# Patient Record
Sex: Female | Born: 1975 | Race: White | Hispanic: No | Marital: Married | Smoking: Current every day smoker
Health system: Southern US, Academic
[De-identification: ages and names within clinical notes are randomized; demographics above are authoritative.]

## PROBLEM LIST (undated history)

## (undated) DIAGNOSIS — L309 Dermatitis, unspecified: Secondary | ICD-10-CM

## (undated) DIAGNOSIS — R7689 Other specified abnormal immunological findings in serum: Secondary | ICD-10-CM

## (undated) DIAGNOSIS — E876 Hypokalemia: Secondary | ICD-10-CM

## (undated) DIAGNOSIS — E559 Vitamin D deficiency, unspecified: Secondary | ICD-10-CM

## (undated) DIAGNOSIS — F17200 Nicotine dependence, unspecified, uncomplicated: Secondary | ICD-10-CM

## (undated) DIAGNOSIS — L708 Other acne: Secondary | ICD-10-CM

## (undated) DIAGNOSIS — I10 Essential (primary) hypertension: Secondary | ICD-10-CM

## (undated) DIAGNOSIS — R768 Other specified abnormal immunological findings in serum: Secondary | ICD-10-CM

## (undated) DIAGNOSIS — K219 Gastro-esophageal reflux disease without esophagitis: Secondary | ICD-10-CM

## (undated) DIAGNOSIS — R4586 Emotional lability: Secondary | ICD-10-CM

## (undated) DIAGNOSIS — F419 Anxiety disorder, unspecified: Secondary | ICD-10-CM

## (undated) DIAGNOSIS — M1990 Unspecified osteoarthritis, unspecified site: Secondary | ICD-10-CM

## (undated) DIAGNOSIS — E538 Deficiency of other specified B group vitamins: Secondary | ICD-10-CM

## (undated) HISTORY — DX: Gastro-esophageal reflux disease without esophagitis: K21.9

## (undated) HISTORY — DX: Essential (primary) hypertension: I10

## (undated) HISTORY — DX: Other specified abnormal immunological findings in serum: R76.89

## (undated) HISTORY — DX: Vitamin D deficiency, unspecified: E55.9

## (undated) HISTORY — DX: Other specified abnormal immunological findings in serum: R76.8

## (undated) HISTORY — DX: Other acne: L70.8

## (undated) HISTORY — DX: Emotional lability: R45.86

## (undated) HISTORY — DX: Deficiency of other specified B group vitamins: E53.8

## (undated) HISTORY — DX: Anxiety disorder, unspecified: F41.9

## (undated) HISTORY — DX: Hypokalemia: E87.6

## (undated) HISTORY — DX: Nicotine dependence, unspecified, uncomplicated: F17.200

## (undated) HISTORY — DX: Dermatitis, unspecified: L30.9

---

## 1998-02-17 ENCOUNTER — Other Ambulatory Visit (HOSPITAL_COMMUNITY): Payer: Self-pay

## 2021-05-05 ENCOUNTER — Encounter (RURAL_HEALTH_CENTER): Payer: Self-pay | Admitting: Physician Assistant

## 2021-05-11 ENCOUNTER — Encounter (RURAL_HEALTH_CENTER): Payer: Self-pay | Admitting: Physician Assistant

## 2021-05-11 NOTE — Progress Notes (Unsigned)
Chronic Disease Management-AMB  This 46 year old white female presents today for CDM.    Some prior records were brought forward in order to continue completeness and accuracy of the medical record. Changes were made on this visit to reflect current status.      BP concern/HTN:  BP readings at work:  Since 12/14/20:  130/89, 127/90, 118/87, 137/85.   symptoms:  01/19/21:  Cough started 1 week into the Lisinopril prescription. After discussion, will change to an ARB.  She denies any chest pain, shortness of breath, palpitations, dizziness, near-syncope, or syncope.  No edema.  No unusual headaches.  She continues to believe that stress plays a role in her blood pressure issues.  from 12/08/20:  Stress related. Learning a new job still at [employer]. Husband lost job late July. Sister's CP is causing her pain. Nerves getting the best of her per pt. Caused eczema flare. Says BP was "up" at [facility] on 11/20/20. Can tell when it's up--feels warm and like she could jump out of her skin.   meds:   Will change to Benicar 20 mg 1 p.o. daily.  Stop Lisinopril 10 due to cough.   investigations:   Will need routine EKG in the future as well.   labs:   See below.    Diarrhea:  symptoms:    01/19/21:  Says the Bentyl cut down the number of times a day from 8 or 9 to 4 or 5 with BID dosing. Stopped her probiotics. No major dietary changes. No recent travel. More gas. No particular trigger food that is obvious. No abdominal pain or blood in her stool. Ready to check GB as the next step. Schedule GB US and Hida scan if needed. Off on 11/18 and day before and after Thanksgiving and Nov. 30. Dec. is okay.  from 12/08/20:  Since increased stress 10/31/20. No abdominal pain or blood in her stool. Going 6 to 8 times a day. Stomach gurgles. Taking probiotics. Tried Tums.  meds:   Try Dicyclomine 20 mg PRN.  investigations:   As of 01/19/21:  Ready to check GB as the next step. Schedule GB US and Hida scan if needed.  from 12/08/20:  Declined at this  time.    Mood swings and Anxiety:  onset:  years ago  symptoms:   01/19/21:  Better. Not as bad. Itching stopped. Can see the benefit of the 20 mg Lexapro. No SI or HI. Doing okay at this point. No further changes needed.  from 12/08/20: Admits to increased stress. Learning a new job still at [employer]. Husband lost job late July. Sister's CP is causing her pain. Nerves getting the best of her per pt. Caused eczema flare. Like a mouse on a wheel that cannot stop. She feels it's causing her diarrhea as well. Jump out of skin. Anxious. Not depressed. Denies any SI or HI. Will increase to Lexapro 20 mg QD.  from 07/08/20:  Aleene Davidson doing well at [facility]. New job. Pleased with meds. No changes needed. No referrals needed. Denies any SI or HI.  see previous related entries by other providers  Meds:   Wellbutrin SR 200 mg 1 po BID.  Lexapro 20.  Previously, on Lexapro 10 mg once daily (Lexapro added fall of 2018). No side effects. (Again, Seroquel increased appetite which caused weight gain. Cymbalta was too expensive in the past. Effexor did not work and kept her awake for 32 hours. Has also tried Zoloft, Prozac, Celexa, and Buspar)  Referrals:   She still does not want to see a specialist or a counselor at this time.      Eczema:  onset:  off and on for several years  symptoms:   01/19/21:  No current symptoms.  from 11/20/20:  Correlates to increased stress. Very itchy. Legs. Went to [place] on 11/20/20 for a flare up to get a shot since cream was not helping. Then, on her back. Cream helped along with Cetirizine.  from 07/08/20:  Overall stable.  see previous related entries by other providers   Meds:   The Triamcinolone 0.5% BID PRN seems to work better than the Betamethasone 0.05% steroid cream.  referrals:   None currently needed      Hx of Joint pain with positive ANA and RNP:  symptoms:    01/19/21; 12/08/20: Regarding referral to Dr. Albertine Grates:  Declined at this time since she recently started a new job and has limited time off. Her husband  lost his job as well which has created a financial strain.   from 07/08/20: Regarding referral to Dr. Albertine Grates:  Declined at this time since she recently started a new job and has limited time off; also, her insurance is not yet effective.  Will let us know when her new insurance becomes effective.   see previous related entries by other providers  meds:   Naproxen PRN helps.  Referral:  As of 01/19/21; 12/08/20:  Regarding referral to Dr. Albertine Grates:  Declined at this time since she recently started a new job and has limited time off. Her husband lost his job as well which has created a financial strain.   Per pt. on 07/08/20:  Regarding referral to Dr. Albertine Grates:  Declined at this time since she recently started a new job and has limited time off; also, her insurance is not yet effective.  Will let us know when her new insurance becomes effective.   Per pt. on 09/06/18:  Says she did see Dr. Driscilla Moats years ago and he told her she had RA. Prefers a 2nd opinion if she follows up and, after discussion, would like to see Dr. Genene Churn. She had declined referral in the past due to insurance issues at the time but that is no longer a factor for her now.  Did see Dr. Anabel Bene (ortho)  around 2018; was dx with osteoarthritis, per patient.  Per pt. 02/23/16:  declines rheumatology referral.  labs:   On 09/06/18, she agrees to repeat lab testing but did not RTC for it with her other labs that were ordered.  09/28/12: Additional labs to evaluate the + ANA show 1 positive result (RNP antibodies are high at >8.0) which can be seen with mixed connective tissue disorders.  05/30/12: Rheumatoid arthritis lab normal. ANA positive.       Vitamin D and hx of Vit B12 deficiency:   meds:   On 07/08/20:  Reminded her to start B12 1000 mcg daily and Vit D 2000 units daily. Only on MVI daily. Previously, OTC Vitamin D 5000 units daily.  labs:   07/10/19:  Corrected calcium 9.1, Vitamin B12 249.Vitamin D 23.8.  11/30/17:  Corrected calcium 8.5.  B12  370.      Tobacco Use:  01/19/21; 12/08/20; 07/08/20:  Is smoking 1/4 ppd on average. Rarely coughs. Not yet ready to quit. Afraid of gaining weight. Declines referral. We discussed the potential risks of continued smoking. Quit years ago for almost 2 years but then started smoking again. Has over a  20 year hx of smoking.     GERD:  onset:   on meds since late 2019  symptoms:   01/19/21; 12/08/20; 07/08/20:  No breakthrough symptoms. Denies abdominal pain, bloody or black looking stool. No heartburn, reflux or indigestion.  see previous related entries  meds:   Omeprazole 20 mg 1 po QD.  referrals:   none  investigations:   no hx of EGD    Mixed hyperlipidemia:  diet/exercise:   She is not closely watching diet or exercising; room for improvement per pt.   meds:   no current meds  labs:   07/10/19: Total bilirubin 1.0, AST 28, ALT 30.  TC 265, TG 207, HDL 49, LDL 175  11/30/17:  TC 201, TG 231, HDL 40, LDL 115. Total bilirubin 0.4, AST 23, ALT 21.      Additional lab review:   Per pt. on 01/19/21:  She found out that it's best coverage wise if she has labs as part of a well visit. Will schedule a well visit.    07/10/19: WBC 8.6, Hgb 15.9, HCT 46.6, platelets 214.  Sodium 135, potassium 4.3.  BUN 15, creatinine 0.8, GFR greater than 59.  Glucose 90. TSH 3.24.  Routine urinalysis: Negative protein, negative glucose, negative blood, negative leukocytes.  Rheumatoid factor less than 10.0.  ANA positive RNP elevated at 7.8.  11/30/2017: WBC 7.7, Hgb 14.7, HCT 43.3, platelets 220.  Sodium 140, potassium 4.2.  BUN 10, creatinine 0.9, GFR greater than 59.  Fasting glucose 87. TSH 1.65.      Last pap:  Per pt. on 01/19/21:  Good for 8 years with her Mirena based on new guidelines per pt--not due for replacement until April 2025. Had pap by GYN in October 2022. See scanned letter dated 01/08/21 she received from Dr. Lavena Bullion indicating that pap smear was normal. Has order for mammogram which she will schedule.  12/24/19:  Pap:  NEGATIVE  FOR INTRAEPITHELIAL LESION OR MALIGNANCY. Satisfactory for evaluation.  Endocervical and/or squamous metaplastic cells (endocervical component) are present.  08/16/2018: Pap smear: Negative.  EC component present.  07/10/17:  Negative.   See EHR  Sent to [facility] in [place] 06/2015 to have IUD placed.  09/28/12--Pap was negative but no EC component seen.      Mammogram:  Per pt. on 01/19/21:  Has order for mammogram from Dr. Lavena Bullion which she will schedule.  Declined 12/24/19. As of 07/08/20:  Will let us know when her new insurance becomes effective.  10/11/2018: Mammogram:  No findings suspicious for the presence of carcinoma are identified on the current examination.  Followup bilateral screening mammography in 1 year is recommended. Category   1: Final Overall Assessment: Negative  06/05/2017:  Normal.    Immunizations:  [employer] Covid vaccine 05/28/20 at [facility]; had flu shot at work 06/03/20 . She declined Pneumo 23, Tdap and HPV during well visit Oct. 2021.                      Assessment & Plan  (1) Mood swings:        Status: Chronic        Code(s):  R45.86 - Emotional lability  (2) Anxiety:        Status: Chronic        Code(s):  F41.9 - Anxiety disorder, unspecified  (3) Eczema:        Status: Chronic        Code(s):  L30.9 -  Dermatitis, unspecified        Qualifiers:          Eczema type: unspecified  Qualified Code(s): L30.9 - Dermatitis, unspecified  (4) Tobacco use disorder:        Status: Chronic        Code(s):  F17.200 - Nicotine dependence, unspecified, uncomplicated  (5) GERD (gastroesophageal reflux disease):        Status: Chronic        Code(s):  K21.9 - Gastro-esophageal reflux disease without esophagitis        Qualifiers:          Esophagitis presence: without esophagitis  Qualified Code(s): K21.9 - Gastro-esophageal reflux disease without esophagitis  (6) Vitamin D deficiency:        Status: Chronic        Code(s):  E55.9 - Vitamin D deficiency, unspecified  (7) Vitamin B12 deficiency:         Status: Chronic        Code(s):  E53.8 - Deficiency of other specified B group vitamins  (8) Joint pain:        Code(s):  M25.50 - Pain in unspecified joint        Qualifiers:          Joint pain location: unspecified  Qualified Code(s): M25.50 - Pain in unspecified joint  (9) Essential hypertension:        Status: Acute        Code(s):  I10 - Essential (primary) hypertension  (10) Reaction, situational, acute, to stress:        Code(s):  F43.0 - Acute stress reaction  (11) Intermittent diarrhea:        Code(s):  R19.7 - Diarrhea, unspecified  (12) Flatulence:        Code(s):  R14.3 - Flatulence  (13) Medication side effects:        Code(s):  T88.7XXA - Unspecified adverse effect of drug or medicament, initial encounter    Plan    MDM    New/changed medications:  NEW: START Benicar 20 mg 1 po QD for BP. STOP Lisinopril 10 mg QD for HTN due to cough     reminded her to start B12 1000 mcg once daily and vitamin D 2000 units once daily.    Renewed chronic medications: As below    Nursing Procedures: None    Immunization: None this visit    Labs: None this visit; declined; encouraged her to find out if it is better for her to get labs using specific diagnoses versus labs as part of a well visit and she voiced understanding and per pt. on 01/19/21, is best to have with well visit so will get scheduled accordingly    Imaging: GB US and HIDA if [place] negative;  ***Will need routine EKG***; she will schedule her mammogram from order provided by GYN    Referrals: None at this time. Declines GI referral at this time for diarrhea. Declines counseling referral. Says she will consider rheumatology referral to Dr. Albertine Grates when things settle down in life for her.    Counseling Done:  NEW: For hypertension: Start Benicar 20 mg 1 po QD. Stop lisinopril 10 mg daily due to cough  Potential side effects were discussed.  Monitor BP, log, and return to clinic if staying greater than 140/90, either parameter on average.  Bring log of readings  for my review to future visits.  She understands the potential risks of uncontrolled BP.  Low-sodium diet.  Weight  loss.  For  anxiety and mood swings: Continue Lexapro to 20 mg daily.  Declines counseling referral.  Go to ER for any suicidal or homicidal thoughts.  For stool, Bentyl as directed.  She feels this issue is definitely stress related.  Also, now agrees to GB US and HIDA if negative for further evaluation.  Monitor closely.  Routine measures as discussed.  Return to clinic if this persists or becomes worse.  Go to ER for any abdominal pain.  Discussed potential explanations for her joint pain and reviewed previous related abnormal lab. Says she will consider rheumatology referral to Dr. Albertine Grates when life settles down for her.  Symptomatic measures/supportive care as discussed.  For eczema and GERD: Currently stable. Continue current meds. Use her eczema med PRN.  Regarding tobacco abuse:  Advised smoking cessation and she understands the potential risks of continued smoking. She declines referral/assistance at this time.  Yearly well-woman/check-ups advised. Last visit with GYN was Oct. 2022; has IUD removed in 2025 per pt. at the 8 year mark.  Should she develop any severe symptoms or should something change and she develop any SI or HI, go to ER.    Diet/Exercise suggested: Low fat, low cholesterol, low sodium. Heart Healthy. Exercise. Weight loss advised.  Minimize alcohol.  Avoid energy drinks.    Follow up as below--well visit in December-- but if any worsening, incomplete resolution, or new problems occur, patient is to return sooner.          Orders:  Orders  US liver gb and duct 02/15/21 R19.7 - Diarrhea, unspecified, R14.3 - Flatulence    NM hepatobiliary w pharm 02/15/21 R19.7 - Diarrhea, unspecified, R14.3 - Flatulence            Medications:  New  olmesartan (Benicar) 20 mg PO QDAY 30 tabs 1RF      Discontinued  lisinopril     Discontinued Reason:  Therapy Change 10 mg  PO QDAY 90 tabs 0RF         Plan Details  Instructions:      Vitamin B-12      Vitamin D  Please follow up in Follow Up:      schedule well visit in Dec.

## 2021-06-02 ENCOUNTER — Ambulatory Visit (RURAL_HEALTH_CENTER): Payer: 59 | Attending: Physician Assistant | Admitting: Physician Assistant

## 2021-06-02 ENCOUNTER — Other Ambulatory Visit: Payer: Self-pay

## 2021-06-02 ENCOUNTER — Encounter (RURAL_HEALTH_CENTER): Payer: Self-pay | Admitting: Physician Assistant

## 2021-06-02 VITALS — BP 123/80 | HR 113 | Temp 97.2°F | Resp 16 | Wt 240.0 lb

## 2021-06-02 DIAGNOSIS — F17210 Nicotine dependence, cigarettes, uncomplicated: Secondary | ICD-10-CM | POA: Insufficient documentation

## 2021-06-02 DIAGNOSIS — Z0000 Encounter for general adult medical examination without abnormal findings: Secondary | ICD-10-CM

## 2021-06-02 DIAGNOSIS — I10 Essential (primary) hypertension: Secondary | ICD-10-CM

## 2021-06-02 DIAGNOSIS — E559 Vitamin D deficiency, unspecified: Secondary | ICD-10-CM

## 2021-06-02 DIAGNOSIS — R4586 Emotional lability: Secondary | ICD-10-CM

## 2021-06-02 DIAGNOSIS — F419 Anxiety disorder, unspecified: Secondary | ICD-10-CM

## 2021-06-02 DIAGNOSIS — R768 Other specified abnormal immunological findings in serum: Secondary | ICD-10-CM | POA: Insufficient documentation

## 2021-06-02 DIAGNOSIS — M2550 Pain in unspecified joint: Secondary | ICD-10-CM

## 2021-06-02 DIAGNOSIS — K219 Gastro-esophageal reflux disease without esophagitis: Secondary | ICD-10-CM | POA: Insufficient documentation

## 2021-06-02 DIAGNOSIS — F17200 Nicotine dependence, unspecified, uncomplicated: Secondary | ICD-10-CM

## 2021-06-02 DIAGNOSIS — E538 Deficiency of other specified B group vitamins: Secondary | ICD-10-CM

## 2021-06-02 NOTE — Nursing Note (Signed)
Pt here to follow up and did not have labs done and is wondering about doing non fasting labs?  Chelsea Aus, LPN

## 2021-06-02 NOTE — Progress Notes (Unsigned)
[facility]  FAMILY MEDICINE, [facility]  [facility] by [facility]  Progress Note    Name: Andrea Hodges M Andrea Hodges MRN:  [id number]   Date: 06/02/2021 Age: 46 y.o.          Chief complaint: Medication Check     Nursing Notes:   Chelsea AusSevert, Melinda, LPN  [birth year]3/22/23 [id number]  Signed  Pt here to follow up and did not have labs done and is wondering about doing non fasting labs?  Chelsea AusMelinda Severt, LPN      Chronic Conditions:       This 46 year old white female presents today for CDM. Has not been able to have her fasting labs yet but will RTC soon for this.     Some prior records were brought forward in order to continue completeness and accuracy of the medical record. Changes were made on this visit to reflect current status.      HTN:  BP readings at work:  No recorded readings.   From 03/02/21:  Regarding BP:  Was 119/73 on 02/23/21.   symptoms:   06/02/21:  She denies any chest pain, shortness of breath, palpitations, dizziness, near-syncope, or syncope.  No edema.     From 01/19/21:  Cough started 1 week into the Lisinopril prescription. After discussion, will change to an ARB.  She denies any chest pain, shortness of breath, palpitations, dizziness, near-syncope, or syncope.  No edema.  No unusual headaches.  She continues to believe that stress plays a role in her blood pressure issues.   See previous entries  meds:    Benicar 20 mg 1 p.o. daily.  Stop Lisinopril 10 due to cough.   Last eye exam:     Jan. 10, 2023 with pressure check but no dilation[facility]   investigations:    Will need routine EKG in the future as well.   labs:    See below.    Diarrhea:  symptoms:     06/02/21:  Getting more solid than not compared to last visit. Improved. Only 1 Dicyclomine in the last 3 weeks. She feels loose stools were stress related. No abdominal pain or blood in her stool.    From 03/02/21:   Feels relaxed. Feels better. Stool issue better. Thinks she can postpone GB imaging and  plans to cancel. Will reconsider it later. Better than it's been over the last 15 days since symptoms started in August.   See previous entries  meds:    Dicyclomine 20 mg PRN. Previously, probiotics.   investigations:    As of 03/02/21:  Prefers to postpone GB imaging since better. [US liver gb and duct- hida scan if needed scheduled for 02/15/21 at 7:00am was cancelled.]   As of 01/19/21:  Ready to check GB as the next step. Schedule GB US and Hida scan if needed.   from 12/08/20:  Declined at this time.    Mood swings and Anxiety:  onset:  years ago  symptoms:    06/02/21:  Doing well on current meds. No SI or HI.    From 01/19/21:  Better. Not as bad. Itching stopped. Can see the benefit of the 20 mg Lexapro. No SI or HI. Doing okay at this point. No further changes needed.   see previous related entries   Meds:    Wellbutrin SR 200 mg 1 po BID.  Lexapro 20.  Previously, on Lexapro 10 mg once daily (Lexapro added fall of 2018). No  side effects. (Again, Seroquel increased appetite which caused weight gain. Cymbalta was too expensive in the past. Effexor did not work and kept her awake for 32 hours. Has also tried Zoloft, Prozac, Celexa, and Buspar)  Referrals:    She still does not want to see a specialist or a counselor at this time.      Eczema:  onset:  off and on for several years  symptoms:    06/02/21; 01/19/21:  No current symptoms.   see previous related entries    Meds:    The Triamcinolone 0.5% BID PRN seems to work better than the Betamethasone 0.05% steroid cream.  referrals:    None currently needed      Hx of Joint pain with positive ANA and RNP:  symptoms:     06/02/21:  Hurts in joints off and on. Knees, wrists, fingers not long ago. Ready for rheumatology referral. Will provide referral.   see previous related entries   meds:    Naproxen PRN helps.  Referral:   As of 01/19/21; 12/08/20:  Regarding referral to Dr. Ninfa Linden:  Declined at this time since she recently started a new job and  has limited time off. Her husband lost his job as well which has created a financial strain.    Per pt. on 07/08/20:  Regarding referral to Dr. Ninfa Linden:  Declined at this time since she recently started a new job and has limited time off; also, her insurance is not yet effective.  Will let us know when her new insurance becomes effective.    Per pt. on 09/06/18:  Says she did see Dr. Lauro Franklin years ago and he told her she had RA. Prefers a 2nd opinion if she follows up and, after discussion, would like to see Dr. Tasia Catchings. She had declined referral in the past due to insurance issues at the time but that is no longer a factor for her now.   Did see Dr. Melida Quitter (ortho)  around 2018; was dx with osteoarthritis, per patient.   Per pt. 02/23/16:  declines rheumatology referral.  labs:    On 09/06/18, she agrees to repeat lab testing but did not RTC for it with her other labs that were ordered.   09/28/12: Additional labs to evaluate the + ANA show 1 positive result (RNP antibodies are high at >8.0) which can be seen with mixed connective tissue disorders.   05/30/12: Rheumatoid arthritis lab normal. ANA positive.                    Vitamin D and hx of Vit B12 deficiency:   meds:    On 06/02/21:  Reminded her to start B12 1000 mcg daily and Vit D 2000 units daily. Only on MVI daily. Previously, OTC Vitamin D 5000 units daily.  labs:    07/10/19:  Corrected calcium 9.1, Vitamin B12 249.Vitamin D 23.8.      Tobacco Use:   06/02/21; 01/19/21:  Is smoking 1/4 ppd on average. Rarely coughs. Not yet ready to quit. Afraid of gaining weight. Declines referral. We discussed the potential risks of continued smoking. Quit years ago for almost 2 years but then started smoking again. Has over a 20 year hx of smoking.     GERD:  onset:    on meds since late 2019  symptoms:    06/02/21; 01/19/21:  No breakthrough symptoms. Denies abdominal pain, bloody or black looking stool. No heartburn, reflux or indigestion.   see previous related  entries  meds:    Omeprazole 20 mg 1 po QD.  referrals:    none  investigations:    no hx of EGD    Mixed hyperlipidemia:  diet/exercise:    She is not closely watching diet or exercising; room for improvement per pt.   meds:    no current meds  labs:    07/10/19: Total bilirubin 1.0, AST 28, ALT 30.  TC 265, TG 207, HDL 49, LDL 175      Additional lab review:   Overdue. Plans to come in soon.      Per pt. on 01/19/21:  She found out that it's best coverage wise if she has labs as part of a well visit. Will schedule a well visit.     07/10/19: WBC 8.6, Hgb 15.9, HCT 46.6, platelets 214.  Sodium 135, potassium 4.3.  BUN 15, creatinine 0.8, GFR greater than 59.  Glucose 90. TSH 3.24.  Routine urinalysis: Negative protein, negative glucose, negative blood, negative leukocytes.  Rheumatoid factor less than 10.0.  ANA positive RNP elevated at 7.8.      Last pap:     Per pt. on 01/19/21:  Good for 8 years with her Mirena based on new guidelines per pt--not due for replacement until April 2025. Had pap by GYN in October 2022. See scanned letter dated 01/08/21 she received from Dr. Clint Guy indicating that pap smear was normal.    12/24/19:  Pap:  NEGATIVE FOR INTRAEPITHELIAL LESION OR MALIGNANCY. Satisfactory for evaluation.  Endocervical and/or squamous metaplastic cells (endocervical component) are present.   08/16/2018: Pap smear: Negative.  EC component present.   07/10/17:  Negative.    09/28/12      Mammogram:   03/02/21:  Mammogram ordered by Dr. Clint Guy:  Negative.    Declined 12/24/19   10/11/2018: Mammogram:  No findings suspicious for the presence of carcinoma are identified on the current examination.  Followup bilateral screening mammography in 1 year is recommended. Category   1: Final Overall Assessment: Negative   06/05/2017:  Normal.    Immunizations:  [employer] Covid vaccine 05/28/20 at [employer]; had flu shot at work 06/03/20 . She declined Pneumo 23, Tdap and HPV during well visit Oct. 2021,  Dec. 2022.    Last Well Visit:  03/02/21.      Objective:  BP 123/80 (Site: Left, Patient Position: Sitting, Cuff Size: Adult)    Pulse (!) 113    Temp 36.2 C (97.2 F) (Skin)    Resp 16    Wt 109 kg (240 lb)    SpO2 97%     General:  Cooperative.  No acute distress.  Alert and oriented x3.    Eyes:  Sclera normal.    Neck: Supple.  No cervical lymphadenopathy.  Thyroid palpable but not enlarged or tender.    Respiratory:  Normal respiratory effort.  Clear to auscultation bilaterally.    Cardio: Regular rate and regular rhythm.  S1 and S2 normal.  No definite murmur.  No carotid bruits.  No abdominal bruits or pulsatile mass.  Pedal pulses palpable.  No significant swelling of ankles.    GI:  Soft.  Bowel sounds present all quadrants.  Nontender.  No hepatosplenomegaly.    Musculoskeletal:  No finger joint deformities.    Skin:  Warm and dry.  No current eczema.    Neuro:  Sensation to light touch intact lower extremities.    Psych:  Speech and movement normal.  Normal  affect.  Thought process and thought content normal.        Assessment/Plan:    ICD-10-CM    1. Primary hypertension  I10       2. Anxiety  F41.9       3. Mood swings  R45.86       4. Positive ANA (antinuclear antibody)  R76.8       5. Gastroesophageal reflux disease, unspecified whether esophagitis present  K21.9       6. Vitamin D deficiency  E55.9       7. B12 deficiency  E53.8       8. Tobacco use disorder  F17.200            MDM    New/changed medications: No new meds. I reminded her to start B12 1000 mcg once daily and vitamin D 2000 units once daily.    Renewed chronic medications: As below    Nursing Procedures: None    Immunization: None this visit    Labs: None this visit; she will RTC soon for routine fasting labs with Dx of well visit since she did not complete when ordered in Dec. 2022. [    Imaging:  Will need routine EKG in the future    Referrals:    Refer to Dr. Ninfa Linden due to hx of positive ANA and joint pain.    Declines GI  referral at this time for diarrhea since better.    Does not need  counseling referral.       Counseling Done:   For hypertension: Chronic, stable. Continue Benicar 20 mg 1 po QD.  Monitor BP, log, and return to clinic if staying greater than 140/90, either parameter on average.  Bring log of readings for my review to future visits.  She understands the potential risks of uncontrolled BP.  Low-sodium diet.  Weight loss.   For  anxiety and mood swings: Chronic, stable. Continue Wellbutrin SR 200 mg BID. Continue Lexapro 20 mg daily.  Declines counseling referral.  Go to ER for any suicidal or homicidal thoughts.   For stool, Bentyl 20 mg as directed.  She feels this issue is definitely stress related.  Decided against GB testing since better.  Routine measures as discussed.  Return to clinic if this persists or becomes worse.  Go to ER for any abdominal pain.   Discussed potential explanations for her joint pain and reviewed previous related abnormal lab. REFER to Dr. Ninfa Linden . Symptomatic measures/supportive care as discussed.   For eczema : Currently stable.  Use her Triamcinolone cream PRN.   GERD:  Stable. Continue Omeprazole 20 mg daily. Routine recommendations.    Regarding tobacco abuse:  Advised smoking cessation and she understands the potential risks of continued smoking. She declines referral/assistance at this time.   Yearly well-woman/check-ups advised. Last visit with GYN was Oct. 2022; has IUD removed in 2025 per pt. at the 8 year mark.   Should she develop any severe symptoms or should something change and she develop any SI or HI, go to ER.    Diet/Exercise suggested: Low fat, low cholesterol, low sodium. Heart Healthy. Exercise. Weight loss advised.  Minimize alcohol.  Avoid energy drinks.    Follow up as below--well visit in December-- but if any worsening, incomplete resolution, or new problems occur, patient is to return sooner.      Tobacco cessation counseling performed.       Orders  Placed This Encounter    CBC/DIFF  COMPREHENSIVE METABOLIC PANEL, NON-FASTING    LIPID PANEL    HGA1C (HEMOGLOBIN A1C WITH EST AVG GLUCOSE)    THYROID STIMULATING HORMONE WITH FREE T4 REFLEX    URINALYSIS, MACROSCOPIC AND MICROSCOPIC W/CULTURE REFLEX    VITAMIN B12    VITAMIN D 25 TOTAL    Referral to External Provider    escitalopram oxalate (LEXAPRO) 20 mg Oral Tablet    olmesartan (BENICAR) 20 mg Oral Tablet    omeprazole (PRILOSEC) 20 mg Oral Capsule, Delayed Release(E.C.)       Current Outpatient Medications   Medication Sig    buPROPion (WELLBUTRIN SR) 200 mg Oral tablet sustained-release 12 hr Take 1 Tablet (200 mg total) by mouth Twice daily    dicyclomine (BENTYL) 20 mg Oral Tablet Take 1 Tablet (20 mg total) by mouth Twice per day as needed    escitalopram oxalate (LEXAPRO) 20 mg Oral Tablet Take 1 Tablet (20 mg total) by mouth Once a day    levonorgestrel (MIRENA) 20 mcg/24 hr intrauterine device by Intrauterine route One time    multivit with min-folic acid (ADULT MULTIVITAMIN GUMMIES) 200 mcg Oral Tablet, Chewable Chew    olmesartan (BENICAR) 20 mg Oral Tablet Take 1 Tablet (20 mg total) by mouth Once a day    omeprazole (PRILOSEC) 20 mg Oral Capsule, Delayed Release(E.C.) Take 1 Capsule (20 mg total) by mouth Once a day    triamcinolone acetonide (ARISTOCORT) 0.5 % Cream Apply topically Three times a day use thin layer        Return in about 6 months (around 12/03/2021) for In Person Visit.    Ashby Dawes, PA-C

## 2021-06-03 DIAGNOSIS — E559 Vitamin D deficiency, unspecified: Secondary | ICD-10-CM | POA: Insufficient documentation

## 2021-06-03 DIAGNOSIS — F419 Anxiety disorder, unspecified: Secondary | ICD-10-CM | POA: Insufficient documentation

## 2021-06-03 DIAGNOSIS — E538 Deficiency of other specified B group vitamins: Secondary | ICD-10-CM | POA: Insufficient documentation

## 2021-06-03 DIAGNOSIS — K219 Gastro-esophageal reflux disease without esophagitis: Secondary | ICD-10-CM | POA: Insufficient documentation

## 2021-06-03 DIAGNOSIS — F17200 Nicotine dependence, unspecified, uncomplicated: Secondary | ICD-10-CM | POA: Insufficient documentation

## 2021-06-03 DIAGNOSIS — R768 Other specified abnormal immunological findings in serum: Secondary | ICD-10-CM | POA: Insufficient documentation

## 2021-06-03 DIAGNOSIS — R4586 Emotional lability: Secondary | ICD-10-CM | POA: Insufficient documentation

## 2021-06-03 DIAGNOSIS — I10 Essential (primary) hypertension: Secondary | ICD-10-CM | POA: Insufficient documentation

## 2021-06-03 MED ORDER — BUPROPION HCL SR 200 MG TABLET,12 HR SUSTAINED-RELEASE
100.0000 mg | ORAL_TABLET | Freq: Two times a day (BID) | ORAL | 1 refills | Status: DC
Start: 2021-06-03 — End: 2021-06-07

## 2021-06-03 MED ORDER — ESCITALOPRAM 20 MG TABLET
20.0000 mg | ORAL_TABLET | Freq: Every day | ORAL | 1 refills | Status: DC
Start: 2021-06-03 — End: 2021-12-06

## 2021-06-03 MED ORDER — OLMESARTAN 20 MG TABLET
20.0000 mg | ORAL_TABLET | Freq: Every day | ORAL | 1 refills | Status: DC
Start: 2021-06-03 — End: 2021-12-06

## 2021-06-03 MED ORDER — OMEPRAZOLE 20 MG CAPSULE,DELAYED RELEASE
20.0000 mg | DELAYED_RELEASE_CAPSULE | Freq: Every day | ORAL | 1 refills | Status: DC
Start: 2021-06-03 — End: 2021-12-06

## 2021-06-07 ENCOUNTER — Other Ambulatory Visit (RURAL_HEALTH_CENTER): Payer: Self-pay | Admitting: Physician Assistant

## 2021-06-07 ENCOUNTER — Telehealth (RURAL_HEALTH_CENTER): Payer: Self-pay | Admitting: Physician Assistant

## 2021-06-07 MED ORDER — BUPROPION HCL SR 200 MG TABLET,12 HR SUSTAINED-RELEASE
200.0000 mg | ORAL_TABLET | Freq: Two times a day (BID) | ORAL | 1 refills | Status: DC
Start: 2021-06-07 — End: 2021-12-06

## 2021-06-07 NOTE — Nursing Note (Signed)
[facility] called and the new rx for Wellbutrin sr can not be scored for the new dosing due to ER so do you want 100 mg one bid?

## 2021-06-30 ENCOUNTER — Telehealth (RURAL_HEALTH_CENTER): Payer: Self-pay | Admitting: Physician Assistant

## 2021-06-30 NOTE — Telephone Encounter (Signed)
I sent the referral, records, and demos to Dr. Jodelle Gross office. They called back today stating that they had tried to contact the patient more than 3 times with no response so they were destroying the referral.

## 2021-12-06 ENCOUNTER — Other Ambulatory Visit: Payer: Self-pay

## 2021-12-06 ENCOUNTER — Ambulatory Visit (RURAL_HEALTH_CENTER): Payer: 59 | Attending: Physician Assistant | Admitting: Physician Assistant

## 2021-12-06 ENCOUNTER — Encounter (RURAL_HEALTH_CENTER): Payer: Self-pay | Admitting: Physician Assistant

## 2021-12-06 VITALS — BP 102/86 | HR 124 | Temp 96.4°F | Resp 16 | Wt 246.0 lb

## 2021-12-06 DIAGNOSIS — F17210 Nicotine dependence, cigarettes, uncomplicated: Secondary | ICD-10-CM | POA: Insufficient documentation

## 2021-12-06 DIAGNOSIS — I10 Essential (primary) hypertension: Secondary | ICD-10-CM

## 2021-12-06 DIAGNOSIS — L309 Dermatitis, unspecified: Secondary | ICD-10-CM

## 2021-12-06 DIAGNOSIS — R768 Other specified abnormal immunological findings in serum: Secondary | ICD-10-CM

## 2021-12-06 DIAGNOSIS — M2550 Pain in unspecified joint: Secondary | ICD-10-CM

## 2021-12-06 DIAGNOSIS — K219 Gastro-esophageal reflux disease without esophagitis: Secondary | ICD-10-CM | POA: Insufficient documentation

## 2021-12-06 DIAGNOSIS — F419 Anxiety disorder, unspecified: Secondary | ICD-10-CM

## 2021-12-06 DIAGNOSIS — R4586 Emotional lability: Secondary | ICD-10-CM

## 2021-12-06 DIAGNOSIS — F17200 Nicotine dependence, unspecified, uncomplicated: Secondary | ICD-10-CM

## 2021-12-06 DIAGNOSIS — Z1211 Encounter for screening for malignant neoplasm of colon: Secondary | ICD-10-CM

## 2021-12-06 MED ORDER — CLOBETASOL 0.05 % TOPICAL CREAM
TOPICAL_CREAM | Freq: Two times a day (BID) | CUTANEOUS | 0 refills | Status: AC | PRN
Start: 2021-12-06 — End: ?

## 2021-12-06 MED ORDER — ESCITALOPRAM 20 MG TABLET
20.0000 mg | ORAL_TABLET | Freq: Every day | ORAL | 1 refills | Status: DC
Start: 2021-12-06 — End: 2022-04-21

## 2021-12-06 MED ORDER — OMEPRAZOLE 20 MG CAPSULE,DELAYED RELEASE
20.0000 mg | DELAYED_RELEASE_CAPSULE | Freq: Every day | ORAL | 1 refills | Status: DC
Start: 2021-12-06 — End: 2022-04-21

## 2021-12-06 MED ORDER — BUPROPION HCL SR 200 MG TABLET,12 HR SUSTAINED-RELEASE
200.0000 mg | ORAL_TABLET | Freq: Two times a day (BID) | ORAL | 1 refills | Status: DC
Start: 2021-12-06 — End: 2022-04-21

## 2021-12-06 MED ORDER — OLMESARTAN 20 MG TABLET
20.0000 mg | ORAL_TABLET | Freq: Every day | ORAL | 1 refills | Status: DC
Start: 2021-12-06 — End: 2022-04-21

## 2021-12-06 MED ORDER — METHYLPREDNISOLONE 4 MG TABLETS IN A DOSE PACK
ORAL_TABLET | ORAL | 0 refills | Status: DC
Start: 2021-12-06 — End: 2022-03-02

## 2021-12-06 NOTE — Progress Notes (Incomplete)
[place], [place]  Operated by [facility]  Progress Note    Name: Andrea Hodges MRN:  [id number]   Date: 12/06/2021 Age: 46 y.o.          Chief complaint: Medication Check and Eczema (Both arms)     Nursing Notes:   Delaney Meigs, LPN  [id number]  Signed  Pt here for follow up and refills and her steroid cream she now is not working .  Delaney Meigs, LPN      This 46 year old white female presents today for follow up. Has not been able to have her fasting labs yet but will RTC soon for this.      Some prior records were brought forward in order to continue completeness and accuracy of the medical record. Changes were made on this visit to reflect current status.       HTN:  BP readings at work:  Has not checked it. Feels like its' been okay.    From 03/02/21:  Regarding BP:  Was 119/73 on 02/23/21.   symptoms:  06/02/21:  She denies any chest pain, shortness of breath, palpitations, dizziness, near-syncope, or syncope.  No edema.    From 01/19/21:  Cough started 1 week into the Lisinopril prescription. After discussion, will change to an ARB.  She denies any chest pain, shortness of breath, palpitations, dizziness, near-syncope, or syncope.  No edema.  No unusual headaches.  She continues to believe that stress plays a role in her blood pressure issues.  See previous entries  meds:   Benicar 20 mg 1 p.o. daily.  Stop Lisinopril 10 due to cough.   Last eye exam:    Jan. 10, 2023 with pressure check but no dilation. Paulino Rily.   investigations:   Will need routine EKG in the future as well.   labs:   See below.     Diarrhea:  symptoms:    12/06/21:  Still solid and okay. Has not needed Bentyl in a long time.  Refer for screening colonoscopy--turns 46 in November.   From 06/02/21:  Getting more solid than not compared to last visit. Improved. Only 1 Dicyclomine in the last 3 weeks. She feels loose stools were stress related. No abdominal pain  or blood in her stool.   From 03/02/21:   Feels relaxed. Feels better. Stool issue better. Thinks she can postpone GB imaging and plans to cancel. Will reconsider it later. Better than it's been over the last 15 days since symptoms started in August.  See previous entries  meds:   Dicyclomine 20 mg PRN. Previously, probiotics.   investigations:   As of 03/02/21:  Prefers to postpone GB imaging since better. [US liver gb and duct- hida scan if needed scheduled for 02/15/21 at 7:00am was cancelled. ]  As of 01/19/21:  Ready to check GB as the next step. Schedule GB US and Hida scan if needed.  from 12/08/20:  Declined at this time.     Mood swings and Anxiety:  onset:  years ago  symptoms:   06/02/21:  Doing well on current meds. No SI or HI.   From 01/19/21:  Better. Not as bad. Itching stopped. Can see the benefit of the 20 mg Lexapro. No SI or HI. Doing okay at this point. No further changes needed.  see previous related entries   Meds:   Wellbutrin SR 200 mg 1 po BID.  Lexapro 20.  Previously, on Lexapro 10 mg once daily (Lexapro added fall of 2018). No side effects. (Again, Seroquel increased appetite which caused weight gain. Cymbalta was too expensive in the past. Effexor did not work and kept her awake for 32 hours. Has also tried Zoloft, Prozac, Celexa, and Buspar)  Referrals:   She still does not want to see a specialist or a counselor at this time.        Eczema:  onset:  off and on for several years  symptoms:   12/06/21:  Bilateral arms since last week. Triamcinolone cream no longer is helping. Wakes up scratching. Try Medrol dose pak and change to Clobetasol.   From 06/02/21; 01/19/21:  No current symptoms.  see previous related entries    Meds:   The Triamcinolone 0.5% BID PRN seems to work better than the Betamethasone 0.05% steroid cream.  referrals:   None currently needed        Hx of Joint pain with positive ANA and RNP:  symptoms:    12/06/21:  REFER again. Unable to keep last appointment.   From 06/02/21:   Hurts in joints off and on. Knees, wrists, fingers not long ago. Ready for rheumatology referral. Will provide referral.  see previous related entries   meds:   Naproxen PRN helps.  Referral:  As of 01/19/21; 12/08/20:  Regarding referral to Dr. Albertine Grates:  Declined at this time since she recently started a new job and has limited time off. Her husband lost his job as well which has created a financial strain.   Per pt. on 07/08/20:  Regarding referral to Dr. Albertine Grates:  Declined at this time since she recently started a new job and has limited time off; also, her insurance is not yet effective.  Will let us know when her new insurance becomes effective.   Per pt. on 09/06/18:  Says she did see Dr. Driscilla Moats years ago and he told her she had RA. Prefers a 2nd opinion if she follows up and, after discussion, would like to see Dr. Genene Churn. She had declined referral in the past due to insurance issues at the time but that is no longer a factor for her now.  Did see Dr. Anabel Bene (ortho)  around 2018; was dx with osteoarthritis, per patient.  Per pt. 02/23/16:  declines rheumatology referral.  labs:   On 09/06/18, she agrees to repeat lab testing but did not RTC for it with her other labs that were ordered.  09/28/12: Additional labs to evaluate the + ANA show 1 positive result (RNP antibodies are high at >8.0) which can be seen with mixed connective tissue disorders.  05/30/12: Rheumatoid arthritis lab normal. ANA positive.                     Vitamin D and hx of Vit B12 deficiency:   meds:   On 06/02/21:  Reminded her to start B12 1000 mcg daily and Vit D 2000 units daily. Only on MVI daily. Previously, OTC Vitamin D 5000 units daily.  labs:   07/10/19:  Corrected calcium 9.1, Vitamin B12 249.Vitamin D 23.8.        Tobacco Use:  06/02/21; 01/19/21:  Is smoking 1/4 ppd on average. Rarely coughs. Not yet ready to quit. Afraid of gaining weight. Declines referral. We discussed the potential risks of continued smoking. Quit years ago for almost  2 years but then started smoking again. Has over a 20 year hx of smoking.  GERD:  onset:   on meds since late 2019  symptoms:   06/02/21; 01/19/21:  No breakthrough symptoms. Denies abdominal pain, bloody or black looking stool. No heartburn, reflux or indigestion.  see previous related entries  meds:   Omeprazole 20 mg 1 po QD.  referrals:   none  investigations:   no hx of EGD     Mixed hyperlipidemia:  diet/exercise:   She is not closely watching diet or exercising; room for improvement per pt.   meds:   no current meds  labs:   07/10/19: Total bilirubin 1.0, AST 28, ALT 30.  TC 265, TG 207, HDL 49, LDL 175        Additional lab review:   Overdue. Plans to come in soon.     Per pt. on 01/19/21:  She found out that it's best coverage wise if she has labs as part of a well visit. Will schedule a well visit.     07/10/19: WBC 8.6, Hgb 15.9, HCT 46.6, platelets 214.  Sodium 135, potassium 4.3.  BUN 15, creatinine 0.8, GFR greater than 59.  Glucose 90. TSH 3.24.  Routine urinalysis: Negative protein, negative glucose, negative blood, negative leukocytes.  Rheumatoid factor less than 10.0.  ANA positive RNP elevated at 7.8.        Last pap:    Per pt. on 01/19/21:  Good for 8 years with her Mirena based on new guidelines per pt--not due for replacement until April 2025. Had pap by GYN in October 2022. See scanned letter dated 01/08/21 she received from Dr. Lavena Bullion indicating that pap smear was normal.   12/24/19:  Pap:  NEGATIVE FOR INTRAEPITHELIAL LESION OR MALIGNANCY. Satisfactory for evaluation.  Endocervical and/or squamous metaplastic cells (endocervical component) are present.  08/16/2018: Pap smear: Negative.  EC component present.  07/10/17:  Negative.   09/28/12        Mammogram:  03/02/21:  Mammogram ordered by Dr. Lavena Bullion:  Negative.   Declined 12/24/19  10/11/2018: Mammogram:  No findings suspicious for the presence of carcinoma are identified on the current examination.  Followup bilateral screening mammography  in 1 year is recommended. Category   1: Final Overall Assessment: Negative  06/05/2017:  Normal.     Immunizations:  [employer] Covid vaccine 05/28/20 at [employer]; had flu shot at work 06/03/20 . She declined Pneumo 23, Tdap and HPV during well visit Oct. 2021, Dec. 2022.    Last Well Visit:  03/02/21.      Objective:  BP 102/86 (Cuff Size: Adult Large)   Pulse (!) 124   Temp (!) 35.8 C (96.4 F) (Skin)   Resp 16   Wt 112 kg (246 lb)   SpO2 97%     General:  Cooperative.  No acute distress.  Alert and oriented x3.    Eyes:  Sclera normal.    Neck: Supple.  No cervical lymphadenopathy.  Thyroid palpable but not enlarged or tender.    Respiratory:  Normal respiratory effort.  Clear to auscultation bilaterally.    Cardio: Regular rate and regular rhythm.  S1 and S2 normal.  No definite murmur.  No carotid bruits.  No abdominal bruits or pulsatile mass.  Pedal pulses palpable.  No significant swelling of ankles.    GI:  Soft.  Bowel sounds present all quadrants.  Nontender.  No hepatosplenomegaly.    Musculoskeletal:  No finger joint deformities.    Skin:  Warm and dry.  No current eczema.  Neuro:  Sensation to light touch intact lower extremities.    Psych:  Speech and movement normal.  Normal affect.  Thought process and thought content normal.        Assessment/Plan:  No diagnosis found.       MDM     New/changed medications: No new meds. I reminded her to start B12 1000 mcg once daily and vitamin D 2000 units once daily.     Renewed chronic medications: As below     Nursing Procedures: None     Immunization: None this visit     Labs: None this visit; she will RTC soon for routine fasting labs with Dx of well visit since she did not complete when ordered in Dec. 2022.  [    Imaging:  Will need routine EKG in the future     Referrals:   Refer to Dr. Albertine Grates due to hx of positive ANA and joint pain.   Declines GI referral at this time for diarrhea since better.   Does not need  counseling referral.        Counseling  Done:  For hypertension: Chronic, stable. Continue Benicar 20 mg 1 po QD.  Monitor BP, log, and return to clinic if staying greater than 140/90, either parameter on average.  Bring log of readings for my review to future visits.  She understands the potential risks of uncontrolled BP.  Low-sodium diet.  Weight loss.  For  anxiety and mood swings: Chronic, stable. Continue Wellbutrin SR 200 mg BID. Continue Lexapro 20 mg daily.  Declines counseling referral.  Go to ER for any suicidal or homicidal thoughts.  For stool, Bentyl 20 mg as directed.  She feels this issue is definitely stress related.  Decided against GB testing since better.  Routine measures as discussed.  Return to clinic if this persists or becomes worse.  Go to ER for any abdominal pain.  Discussed potential explanations for her joint pain and reviewed previous related abnormal lab. REFER to Dr. Albertine Grates . Symptomatic measures/supportive care as discussed.  For eczema : Currently stable.  Use her Triamcinolone cream PRN.  GERD:  Stable. Continue Omeprazole 20 mg daily. Routine recommendations.   Regarding tobacco abuse:  Advised smoking cessation and she understands the potential risks of continued smoking. She declines referral/assistance at this time.  Yearly well-woman/check-ups advised. Last visit with GYN was Oct. 2022; has IUD removed in 2025 per pt. at the 8 year mark.  Should she develop any severe symptoms or should something change and she develop any SI or HI, go to ER.     Diet/Exercise suggested: Low fat, low cholesterol, low sodium. Heart Healthy. Exercise. Weight loss advised.  Minimize alcohol.  Avoid energy drinks.     Follow up as below--well visit in December-- but if any worsening, incomplete resolution, or new problems occur, patient is to return sooner.      Tobacco cessation counseling performed.       Orders Placed This Encounter    buPROPion (WELLBUTRIN SR) 200 mg Oral tablet sustained-release 12 hr    olmesartan (BENICAR) 20 mg  Oral Tablet    omeprazole (PRILOSEC) 20 mg Oral Capsule, Delayed Release(E.C.)    Methylprednisolone (MEDROL DOSEPACK) 4 mg Oral Tablets, Dose Pack    clobetasoL (TEMOVATE) 0.05 % Cream    escitalopram oxalate (LEXAPRO) 20 mg Oral Tablet       Current Outpatient Medications   Medication Sig    buPROPion (WELLBUTRIN SR) 200 mg Oral tablet  sustained-release 12 hr Take 1 Tablet (200 mg total) by mouth Twice daily    clobetasoL (TEMOVATE) 0.05 % Cream Apply topically Twice per day as needed    dicyclomine (BENTYL) 20 mg Oral Tablet Take 1 Tablet (20 mg total) by mouth Twice per day as needed    escitalopram oxalate (LEXAPRO) 20 mg Oral Tablet Take 1 Tablet (20 mg total) by mouth Once a day    levonorgestrel (MIRENA) 20 mcg/24 hr intrauterine device by Intrauterine route One time    Methylprednisolone (MEDROL DOSEPACK) 4 mg Oral Tablets, Dose Pack Take as instructed with food.    multivit with min-folic acid (ADULT MULTIVITAMIN GUMMIES) 200 mcg Oral Tablet, Chewable Chew    olmesartan (BENICAR) 20 mg Oral Tablet Take 1 Tablet (20 mg total) by mouth Once a day    omeprazole (PRILOSEC) 20 mg Oral Capsule, Delayed Release(E.C.) Take 1 Capsule (20 mg total) by mouth Once a day        Return in about 3 months (around 03/02/2022) for In Person Visit for well visit.    Angelica Chessman, PA-C

## 2021-12-06 NOTE — Nursing Note (Signed)
Pt here for follow up and refills and her steroid cream she now is not working .  Delaney Meigs, LPN

## 2021-12-20 ENCOUNTER — Other Ambulatory Visit (RURAL_HEALTH_CENTER): Payer: Self-pay | Admitting: Physician Assistant

## 2022-01-10 ENCOUNTER — Telehealth (RURAL_HEALTH_CENTER): Payer: Self-pay | Admitting: Physician Assistant

## 2022-01-10 NOTE — Telephone Encounter (Signed)
01/10/2022 Pt has an appt. 02/08/22 @ 2.[email or web address]

## 2022-02-09 ENCOUNTER — Ambulatory Visit (INDEPENDENT_AMBULATORY_CARE_PROVIDER_SITE_OTHER): Payer: Self-pay | Admitting: Surgery

## 2022-03-02 ENCOUNTER — Encounter (RURAL_HEALTH_CENTER): Payer: Self-pay | Admitting: Physician Assistant

## 2022-03-02 ENCOUNTER — Ambulatory Visit (RURAL_HEALTH_CENTER): Payer: 59 | Attending: Physician Assistant | Admitting: Physician Assistant

## 2022-03-02 ENCOUNTER — Other Ambulatory Visit: Payer: Self-pay

## 2022-03-02 VITALS — BP 132/80 | HR 96 | Temp 97.9°F | Resp 16 | Wt 249.0 lb

## 2022-03-02 DIAGNOSIS — F419 Anxiety disorder, unspecified: Secondary | ICD-10-CM | POA: Insufficient documentation

## 2022-03-02 DIAGNOSIS — G4700 Insomnia, unspecified: Secondary | ICD-10-CM | POA: Insufficient documentation

## 2022-03-02 DIAGNOSIS — R200 Anesthesia of skin: Secondary | ICD-10-CM | POA: Insufficient documentation

## 2022-03-02 DIAGNOSIS — R4586 Emotional lability: Secondary | ICD-10-CM | POA: Insufficient documentation

## 2022-03-02 DIAGNOSIS — E669 Obesity, unspecified: Secondary | ICD-10-CM | POA: Insufficient documentation

## 2022-03-02 DIAGNOSIS — R5383 Other fatigue: Secondary | ICD-10-CM | POA: Insufficient documentation

## 2022-03-02 DIAGNOSIS — K219 Gastro-esophageal reflux disease without esophagitis: Secondary | ICD-10-CM | POA: Insufficient documentation

## 2022-03-02 DIAGNOSIS — R202 Paresthesia of skin: Secondary | ICD-10-CM | POA: Insufficient documentation

## 2022-03-02 DIAGNOSIS — I10 Essential (primary) hypertension: Secondary | ICD-10-CM | POA: Insufficient documentation

## 2022-03-02 DIAGNOSIS — Z79899 Other long term (current) drug therapy: Secondary | ICD-10-CM | POA: Insufficient documentation

## 2022-03-02 DIAGNOSIS — R0683 Snoring: Secondary | ICD-10-CM | POA: Insufficient documentation

## 2022-03-02 DIAGNOSIS — F17210 Nicotine dependence, cigarettes, uncomplicated: Secondary | ICD-10-CM | POA: Insufficient documentation

## 2022-03-02 DIAGNOSIS — G479 Sleep disorder, unspecified: Secondary | ICD-10-CM | POA: Insufficient documentation

## 2022-03-02 MED ORDER — TRAZODONE 50 MG TABLET
ORAL_TABLET | ORAL | 1 refills | Status: DC
Start: 2022-03-02 — End: 2022-04-21

## 2022-03-02 MED ORDER — ZEPBOUND 2.5 MG/0.5 ML SUBCUTANEOUS PEN INJECTOR
2.5000 mg | PEN_INJECTOR | SUBCUTANEOUS | 0 refills | Status: DC
Start: 2022-03-02 — End: 2022-04-21

## 2022-03-02 NOTE — Progress Notes (Signed)
[facility]  FAMILY MEDICINE, [facility]  Operated by [facility]  Progress Note    Name: Andrea Hodges MRN:  [id number]   Date: 03/02/2022 Age: 46 y.o.          Chief complaint: Medication Check     Nursing Notes:   Chelsea Aus, LPN  28/41/32 4401  Signed  Pt here for follow up and refills .  Chelsea Aus, LPN      This 46 year old white female presents today for follow up. Has NEW concerns/interval updates.      Some prior records were brought forward in order to continue completeness and accuracy of the medical record. Changes were made on this visit to reflect current status.      NEW:  Had labs ordered by Dr. Ninfa Linden 02/08/22. Likely OA, not RA. Is retesting for lupus. Vitamin D was low. Told to get OTC Vit D 5000 units until discuss all results 03/15/22.       NEW:  Right hand tingles. X 2 weeks. Daily. Is left handed. Randomly. No pain. Neck, shoulder, elbow seem okay.  Prefers to monitor and declines investigations at this time.     NEW:  Sleeping difficulty:    Symptoms:    03/02/22:  Hardly sleeps 2 to 3 hours before just waking up or waking up to void. Does not feel rested upon awakening.  Struggles to fall asleep and stay asleep. Has cut back on caffeine, screen time. Has to be exhausted to get any sleep. Does not snore herself awake. Once a month, has restless legs but not consistently. Not stressed or anxious. Melatonin made her eyelids heavy but did not make her sleepy. Does snore.    Meds:    After discussion, will try Trazodone   Investigations:    She agrees to home sleep study to rule out OSA     NEW;  Weight management:    Wants to try a weight loss medication.  Feels like she cannot control her eating and appetite is out of control. Understands the importance of healthy eating and exercise.  No family hx of thyroid cancer. She was interested in [employer]. However, she checked her formulary and both meds are not covered by her plan.   Consider OTC Karie Mainland.      HTN:  BP readings at work:    03/02/22; 12/06/21:  Has not checked it. Feels like its' been okay.     symptoms:  03/02/22; 12/06/21:  She denies any chest pain, shortness of breath, palpitations, dizziness, near-syncope, or syncope.  No edema.    meds:   Benicar 20 mg 1 p.o. daily.    Stopped Lisinopril 10 due to cough.   Last eye exam:    Jan. 10, 2023 with pressure check but no dilation. Smiley Houseman.   investigations:   Will need routine EKG in the future as well.   labs:   See below.     Hx of Diarrhea:  symptoms:    03/02/22:  Cancelled her consultation for consultation for colonoscopy. Plans to reschedule.   From 12/06/21:  Still solid and okay. Feels previous diarrhea was stress related. No abdominal pain or bloody stool. Has not needed Bentyl in a long time.  Refer for screening colonoscopy--turns 46 in November.   meds:   Dicyclomine 20 mg PRN. Previously, probiotics.   investigations:   Per patient on 03/02/22:  cancelled her consultation for colonoscopy. Plans to reschedule.  On 12/06/21:  She agrees to referral for colonoscopy after she turns 46 in November.   As of 03/02/21:  Prefers to postpone GB imaging since better. [US liver gb and duct- hida scan if needed scheduled for 02/15/21 at 7:00am was cancelled. ]  As of 01/19/21:  Ready to check GB as the next step. Schedule GB US and Hida scan if needed.  from 12/08/20:  Declined at this time.     Mood swings and Anxiety:  onset:  years ago  symptoms:   03/02/22; 12/06/21  Anxiety remains stable on current meds and mood as well . No changes needed. Doing well on current meds. No SI or HI.   Meds:   Wellbutrin SR 200 mg 1 po BID.  Lexapro 20 mg QD.    Previously, on Lexapro 10 mg once daily (Lexapro added fall of 2018). No side effects. (Again, Seroquel increased appetite which caused weight gain. Cymbalta was too expensive in the past. Effexor did not work and kept her awake for 32 hours. Has also tried Zoloft, Prozac, Celexa, and  Buspar)  Referrals:   She still does not want to see a specialist or a counselor at this time.        Eczema:  onset:  off and on for several years  symptoms:   03/02/22:  No concerns voiced.   From  12/06/21:  Having a flare up. Bilateral arms since last week. Triamcinolone cream no longer is helping. Wakes up scratching. Try Medrol dose pak and change to Clobetasol.    Meds:   Clobetasol 0.05% cream PRN. Previously, on Triamcinolone 0.5% BID PRN. It has worked better than the Betamethasone 0.05% steroid cream.  referrals:   None currently needed        Hx of Joint pain with positive ANA and RNP:  symptoms:    03/02/22:  Had labs ordered by Dr. Ninfa LindenAhmad 02/08/22. Likely OA, not RA. Is retesting for lupus. Vitamin D was low. Told to get OTC Vit D 5000 units until discuss all results 03/15/22.   From 12/06/21:  Hurts in joints off and on. Knees, wrists, fingers not long ago. Ready again for rheumatology referral. Will provide another referral. Unable to keep last appointment.    meds:   Naproxen PRN helps.  Referral:  From our referral:  1st visit with Dr. Ninfa LindenAhmad was 02/08/22.  From 12/06/21:  Ready again for rheumatology referral. Will provide another referral. Unable to keep last appointment.  As of 01/19/21; 12/08/20:  Regarding referral to Dr. Ninfa LindenAhmad:  Declined at this time since she recently started a new job and has limited time off. Her husband lost his job as well which has created a financial strain.   Per pt. on 07/08/20:  Regarding referral to Dr. Ninfa LindenAhmad:  Declined at this time since she recently started a new job and has limited time off; also, her insurance is not yet effective.  Will let us know when her new insurance becomes effective.   Per pt. on 09/06/18:  Says she did see Dr. Lauro FranklinSaikali years ago and he told her she had RA. Prefers a 2nd opinion if she follows up and, after discussion, would like to see Dr. Tasia CatchingsAhmed. She had declined referral in the past due to insurance issues at the time but that is no longer a  factor for her now.  Did see Dr. Melida QuitterMcCarthy (ortho)  around 2018; was dx with osteoarthritis, per patient.  Per pt. 02/23/16:  declines rheumatology referral.  labs:   On 09/06/18, she agrees to repeat lab testing but did not RTC for it with her other labs that were ordered.  09/28/12: Additional labs to evaluate the + ANA show 1 positive result (RNP antibodies are high at >8.0) which can be seen with mixed connective tissue disorders.  05/30/12: Rheumatoid arthritis lab normal. ANA positive.                     Vitamin D and hx of Vit B12 deficiency:   meds:   Per patient on 03/02/22:  Had labs ordered by Dr. Ninfa Linden 02/08/22. Vitamin D was low. Told to get OTC Vit D 5000 units until discuss all results 03/15/22.   On 06/02/21:  Reminded her to start B12 1000 mcg daily and Vit D 2000 units daily. Only on MVI daily. Previously, OTC Vitamin D 5000 units daily.  labs:   07/10/19:  Corrected calcium 9.1, Vitamin B12 249.Vitamin D 23.8.        Tobacco Use:  03/02/22; 12/06/21:  Is smoking 1/4 ppd on average. Rarely coughs. Not yet ready to quit. Afraid of gaining weight. Declines referral. We discussed the potential risks of continued smoking. Quit years ago for almost 2 years but then started smoking again. Has over a 20 year hx of smoking.      GERD:  onset:   on meds since late 2019  symptoms:   03/02/22; 12/06/21:  No breakthrough symptoms. Denies abdominal pain, bloody or black looking stool. No heartburn, reflux or indigestion.  meds:   Omeprazole 20 mg 1 po QD.  referrals:   none  investigations:   no hx of EGD     Mixed hyperlipidemia:  diet/exercise:   She is not closely watching diet or exercising; room for improvement per pt.   meds:   no current meds  labs:   07/10/19: Total bilirubin 1.0, AST 28, ALT 30.  TC 265, TG 207, HDL 49, LDL 175        Additional lab review:     Had labs for Dr. Ninfa Linden in November 2023--will [facility] to get from [facility]    Per pt. on 01/19/21:  She found out that it's best coverage wise if she has labs  as part of a well visit. Will schedule a well visit.     07/10/19: WBC 8.6, Hgb 15.9, HCT 46.6, platelets 214.  Sodium 135, potassium 4.3.  BUN 15, creatinine 0.8, GFR greater than 59.  Glucose 90. TSH 3.24.  Routine urinalysis: Negative protein, negative glucose, negative blood, negative leukocytes.  Rheumatoid factor less than 10.0.  ANA positive RNP elevated at 7.8.        Last pap:    Per pt. on 01/19/21:  Good for 8 years with her Mirena based on new guidelines per pt--not due for replacement until April 2025. Had pap by GYN in October 2022. See scanned letter dated 01/08/21 she received from Dr. Clint Guy indicating that pap smear was normal.   12/24/19:  Pap:  NEGATIVE FOR INTRAEPITHELIAL LESION OR MALIGNANCY. Satisfactory for evaluation.  Endocervical and/or squamous metaplastic cells (endocervical component) are present.  08/16/2018: Pap smear: Negative.  EC component present.  07/10/17:  Negative.   09/28/12        Mammogram:  03/02/21:  Mammogram ordered by Dr. Clint Guy:  Negative.   Declined 12/24/19  10/11/2018: Mammogram:  No findings suspicious for the presence of carcinoma are identified on the current examination.  Followup bilateral screening mammography in  1 year is recommended. Category   1: Final Overall Assessment: Negative  06/05/2017:  Normal.     Immunizations:  [employer] Covid vaccine 05/28/20 at [employer]; yearly flu shots advised . She declined Pneumo 23, Tdap and HPV during well visit Oct. 2021, Dec. 2022.    Last Well Visit:  03/02/21.      Objective:  BP 132/80 (Cuff Size: Adult Large)   Pulse 96   Temp 36.6 C (97.9 F) (Skin)   Resp 16   Wt 113 kg (249 lb)   SpO2 98%     General:  Cooperative.  No acute distress.  Alert and oriented x3.    Eyes:  Sclera normal.    Neck: Supple.  No cervical lymphadenopathy.  Thyroid palpable but not enlarged or tender.    Lungs:  Normal respiratory effort.  Clear to auscultation bilaterally.    Heart: Regular rate and regular rhythm.  S1 and S2 normal.  No  definite murmur.  No carotid bruits.  No abdominal bruits or pulsatile mass.  Pedal pulses palpable.  No significant swelling of ankles.    Abdomen:  Soft.  Bowel sounds present all quadrants.  Nontender.  No hepatosplenomegaly.    Musculoskeletal:  No finger joint deformities. FROM neck and bilateral upper extremities.No focal tenderness. Radial pulses 2+. Brisk capillary refill.     Skin:  Warm and dry.  No current eczema.    Neuro:  Sensation to light touch intact both upper and lower extremities.    Psych:  Speech and movement normal.  Normal affect.  Thought process and thought content normal.        Assessment/Plan:    ICD-10-CM    1. Primary hypertension  I10 Referral to External Provider      2. Anxiety  F41.9       3. Mood swings  R45.86       4. Gastroesophageal reflux disease, unspecified whether esophagitis present  K21.9       5. Insomnia, unspecified type  G47.00       6. Numbness and tingling of right hand  R20.0     R20.2       7. Obesity, unspecified classification, unspecified obesity type, unspecified whether serious comorbidity present  E66.9 Referral to External Provider      8. Snores  R06.83 Referral to External Provider      9. Fatigue, unspecified type  R53.83 Referral to External Provider      10. Sleeping difficulties  G47.9 Referral to External Provider            MDM     New/changed medications: For sleep, Trazodone 50 mg 1/2 to 1 po HS     Renewed chronic medications: As below     Nursing Procedures: None     Immunization: None this visit     Labs: None this visit; she will have  routine fasting labs with well visit in January      Imaging:  Will get home sleep study; declines investigations for right hand tingling; Will need routine EKG in the future     Referrals:  No new referrals       Counseling Done:  NEW:  Insomnia:  Start Trazodone 50 mg 1/2 to 1 po HS. Sleep hygiene discussed. Minimize caffeine. Get home sleep study to rule out OSA as well. Go from there.     NEW:  Tingling of  right hand:  Discussed potential explanations. At this time,  she prefers to observe and declines any investigations. RTC if worse.     Obesity:  Understands the importance of healthy eating and exercise.  She was interested in [employer]. However, she checked her formulary and both meds are not covered by her plan.  She will see if she can get it with a discount card. No family h istory of thyroid cancer or MEN. Discussed pros/cons/risks/benefits of GLP. If not, she will consider OTC Karie Mainland. Will follow up about this during her well visit in January.     Hypertension: Chronic, stable. Continue Benicar 20 mg 1 po QD.  Monitor BP, log, and return to clinic if staying greater than 140/90, either parameter on average.  Bring log of readings for my review to future visits.  She understands the potential risks of uncontrolled BP.  Low-sodium diet.  Weight loss. Go to ER for any cardiovascular symptoms.     Anxiety and mood swings: Chronic, stable. Continue Wellbutrin SR 200 mg BID. Continue Lexapro 20 mg daily.  Monitor. Declines counseling referral.  Bentyl 20 mg as directed PRN loose stool.  She feels this issue is definitely stress related.  Decided against GB testing since better.  Routine measures as discussed.  Return to clinic if this persists or becomes worse.  Go to ER for any abdominal pain. Go to ER for any suicidal or homicidal thoughts.    Eczema:  Stable. Continue PRN use of  Clobetasol 0.05% cream topically as directed. F/U if not improving or worse.     GERD:  Stable. Continue Omeprazole 20 mg daily. Routine recommendations.     Positive ANA; arthralgia:  Continue follow up visits with Dr. Ninfa Linden and will await his diagnosis and recommendations . Symptomatic measures/supportive care as discussed.    Need for screening colonoscopy: She cancelled her consultation but plans to reschedule.       Tobacco abuse:  Advised smoking cessation and she understands the potential risks of continued smoking. She  declines referral/assistance at this time.    Yearly well-woman/check-ups advised. Last visit with GYN was Oct. 2022; has IUD removed in 2025 per pt. at the 8 year mark.    Should she develop any severe symptoms or should something change and she develop any SI or HI, go to ER.     Diet/Exercise suggested: Low fat, low cholesterol, low sodium. Heart Healthy. Exercise. Weight loss advised.  Minimize alcohol.  Avoid energy drinks.     Follow up as below--well visit in January-- but if any worsening, incomplete resolution, or new problems occur, patient is to return sooner.      Tobacco cessation counseling performed.  She is not interested in meds for smoking cessation and/or referral at this time. Accepts risks of continued smoking.       Orders Placed This Encounter    Referral to External Provider    traZODone (DESYREL) 50 mg Oral Tablet    tirzepatide, weight loss, (ZEPBOUND) 2.5 mg/0.5 mL Subcutaneous Pen Injector       Current Outpatient Medications   Medication Sig    buPROPion (WELLBUTRIN SR) 200 mg Oral tablet sustained-release 12 hr Take 1 Tablet (200 mg total) by mouth Twice daily    cholecalciferol, vitamin D3, 125 mcg (5,000 unit) Oral Capsule Take 1 Capsule (5,000 Units total) by mouth Once a day    clobetasoL (TEMOVATE) 0.05 % Cream Apply topically Twice per day as needed    dicyclomine (BENTYL) 20 mg Oral Tablet Take 1 Tablet (20  mg total) by mouth Twice per day as needed    escitalopram oxalate (LEXAPRO) 20 mg Oral Tablet Take 1 Tablet (20 mg total) by mouth Once a day    levonorgestrel (MIRENA) 20 mcg/24 hr intrauterine device by Intrauterine route One time    Methylprednisolone (MEDROL DOSEPACK) 4 mg Oral Tablets, Dose Pack Take as instructed with food.    multivit with min-folic acid (ADULT MULTIVITAMIN GUMMIES) 200 mcg Oral Tablet, Chewable Chew (Patient not taking: Reported on 03/02/2022)    olmesartan (BENICAR) 20 mg Oral Tablet Take 1 Tablet (20 mg total) by mouth Once a day    omeprazole  (PRILOSEC) 20 mg Oral Capsule, Delayed Release(E.C.) Take 1 Capsule (20 mg total) by mouth Once a day        Return in about 1 month (around 04/02/2022) for  for well visit.    Ashby Dawes, PA-C

## 2022-03-02 NOTE — Nursing Note (Signed)
Pt here for follow up and refills .  Jackline Castilla, LPN

## 2022-03-13 ENCOUNTER — Other Ambulatory Visit: Payer: Self-pay

## 2022-03-30 ENCOUNTER — Ambulatory Visit (RURAL_HEALTH_CENTER): Payer: 59 | Admitting: Physician Assistant

## 2022-04-08 ENCOUNTER — Encounter (RURAL_HEALTH_CENTER): Payer: Self-pay | Admitting: Physician Assistant

## 2022-04-21 ENCOUNTER — Ambulatory Visit: Payer: 59 | Attending: Physician Assistant | Admitting: Physician Assistant

## 2022-04-21 ENCOUNTER — Ambulatory Visit (RURAL_HEALTH_CENTER): Payer: 59 | Attending: Physician Assistant | Admitting: Physician Assistant

## 2022-04-21 ENCOUNTER — Encounter (RURAL_HEALTH_CENTER): Payer: Self-pay | Admitting: Physician Assistant

## 2022-04-21 ENCOUNTER — Other Ambulatory Visit: Payer: Self-pay

## 2022-04-21 VITALS — BP 124/86 | HR 97 | Temp 98.3°F | Resp 16 | Ht 68.0 in | Wt 252.0 lb

## 2022-04-21 DIAGNOSIS — Z0000 Encounter for general adult medical examination without abnormal findings: Secondary | ICD-10-CM | POA: Insufficient documentation

## 2022-04-21 DIAGNOSIS — Z01419 Encounter for gynecological examination (general) (routine) without abnormal findings: Secondary | ICD-10-CM | POA: Insufficient documentation

## 2022-04-21 DIAGNOSIS — Z87891 Personal history of nicotine dependence: Secondary | ICD-10-CM | POA: Insufficient documentation

## 2022-04-21 DIAGNOSIS — E669 Obesity, unspecified: Secondary | ICD-10-CM | POA: Insufficient documentation

## 2022-04-21 DIAGNOSIS — I10 Essential (primary) hypertension: Secondary | ICD-10-CM | POA: Insufficient documentation

## 2022-04-21 DIAGNOSIS — G4700 Insomnia, unspecified: Secondary | ICD-10-CM | POA: Insufficient documentation

## 2022-04-21 DIAGNOSIS — Z1239 Encounter for other screening for malignant neoplasm of breast: Secondary | ICD-10-CM

## 2022-04-21 DIAGNOSIS — E538 Deficiency of other specified B group vitamins: Secondary | ICD-10-CM | POA: Insufficient documentation

## 2022-04-21 LAB — LIPID PANEL
CHOL/HDL RATIO: 5.3
CHOLESTEROL: 248 mg/dL — ABNORMAL HIGH (ref <200)
HDL CHOL: 47 mg/dL (ref 23–92)
LDL CALC: 170 mg/dL — ABNORMAL HIGH (ref 0–100)
TRIGLYCERIDES: 154 mg/dL — ABNORMAL HIGH (ref <=150)
VLDL CALC: 31 mg/dL (ref 0–50)

## 2022-04-21 LAB — VITAMIN B12: VITAMIN B 12: 313 pg/mL (ref 180–914)

## 2022-04-21 MED ORDER — OMEPRAZOLE 20 MG CAPSULE,DELAYED RELEASE
20.0000 mg | DELAYED_RELEASE_CAPSULE | Freq: Every day | ORAL | 0 refills | Status: DC
Start: 2022-04-21 — End: 2022-09-19

## 2022-04-21 MED ORDER — TRAZODONE 100 MG TABLET
ORAL_TABLET | ORAL | 0 refills | Status: DC
Start: 2022-04-21 — End: 2022-08-01

## 2022-04-21 MED ORDER — ESCITALOPRAM 20 MG TABLET
20.0000 mg | ORAL_TABLET | Freq: Every day | ORAL | 0 refills | Status: DC
Start: 2022-04-21 — End: 2022-09-19

## 2022-04-21 MED ORDER — OLMESARTAN 20 MG TABLET
20.0000 mg | ORAL_TABLET | Freq: Every day | ORAL | 0 refills | Status: DC
Start: 2022-04-21 — End: 2022-09-19

## 2022-04-21 MED ORDER — BUPROPION HCL SR 200 MG TABLET,12 HR SUSTAINED-RELEASE
200.0000 mg | ORAL_TABLET | Freq: Two times a day (BID) | ORAL | 0 refills | Status: DC
Start: 2022-04-21 — End: 2023-01-10

## 2022-04-21 NOTE — Progress Notes (Signed)
[place], [place]  Operated by [facility]  Progress Note    Name: Andrea Hodges MRN:  [id number]   Date: 04/21/2022 Age: 47 y.o.          Chief complaint: Annual Exam (Well woman )    Nursing Notes:   Delaney Meigs, LPN  [id number]  Signed  Pt here for well woman   Delaney Meigs, LPN      Delaney Meigs, [place]  [id number]  Signed     04/21/22 1416   Depression Screen   Little interest or pleasure in doing things. 0   Feeling down, depressed, or hopeless 0   PHQ 2 Total 0          Andrea Hodges presents today for a well visit. Has a NEW concern as well.     Some prior records were brought forward in order to continue completeness and accuracy of the medical record. Changes were made on this visit to reflect current status.     ADDRESSED IN ADDITION TO WELL VISIT:    Sleeping difficulty; hypersomnia:    Symptoms:    NEW:  04/21/22:  Needs a higher dose of Trazodone. 50 mg nightly helps but not quite enough. Home sleep study is pending.   From 03/02/22:  Hardly sleeps 2 to 3 hours before just waking up or waking up to void. Does not feel rested upon awakening.  Struggles to fall asleep and stay asleep. Has cut back on caffeine, screen time. Has to be exhausted to get any sleep. Does not snore herself awake. Once a month, has restless legs but not consistently. Not stressed or anxious. Melatonin made her eyelids heavy but did not make her sleepy. Does snore.    Meds:    After discussion, increase to Trazodone 100 mg HS. Currently on 50 mg HS.   Investigations:    Home sleep study to rule out OSA is pending.         Adult Physical    [id number]  Pneumo 23 status: Declined  Risk and benefits of immunization discussed: yes    PCV13/PREVNAR  Prevnar 13 status: Unimmunized  Risk and benefits of immunization discussed: yes    PREVNAR 20  Prevnar 20 status: N/A  Risk and benefits of immunization discussed: yes    COVID 19  Covid immunization status:  Immunized  COVID-19 immunization history:  Had J and J Covid vaccine March of 2022. Booster advised.  Risk Assessment: High Risk  Risk and benefits of immunization discussed: yes    INFLUENZA VACCINE  Flu immunization status: Immunized  Flu immunization history:  Advised to get yearly. Had 01/16/22.  Risk and benefits of immunization discussed: yes    TETANUS/DIPTHERIA/PERTUSSIS Tdap  Immunization Status: Declined  Tetanus/Tdap immunization history:  Declined at this time.  Risk and benefits of immunization discussed: yes    HEPATITIS A    Hep A immunization status: N/A  Hep A immunization history:  No travel plans.  Risk Assessment: Low Risk  Risk and benefits of immunization discussed: yes    HEPATITIS B  Hep B immunization status: Unimmunized  Hep B immunization history:  No travel plans.  Risk Assessment: Low Risk  Risk and benefits of immunization discussed: yes    SHINGRIX VACCINE  Immunization Status: N/A  Risk and benefits of immunization discussed: yes    GARDASIL  Gardisil immunization status: Declined  Gardasil immunization history:  Advised but declined.  Risk and benefits of immunization discussed: yes    MenACWY (Menactra, Menveo)  Menactra/Menveo immunization status: N/A    MenB vaccine (Bexsero, Trumenba)  Bexsero/Trumenba immunization status: N/A    MMR  MMR immunization status: Immunized  MMR immunization history:  Had as a child. No travel plans.  Risk and benefits of immunization discussed: yes    BREAST CANCER SCREENING  BREAST CANCER RISK  Personal history of breast cancer: no  Family history of breast cancer: no  BRCA1/BRCA2 gene mutation: Unknown  Patient Previous Biopsy: No  Patient Previous Hormone Therapy: No  Past Mammograms/US/MRI (date,facility,findings):  Advised yearly mammograms. She denies breast symptoms.  03/02/21.  10/11/2018: Mammogram:  No findings suspicious for the presence of carcinoma are identified on the current examination.  Followup bilateral screening mammography in 1  year is recommended. Category   1: Final Overall Assessment: Negative  mammogram 06/05/2017, normal  Mammogram Screening Suggested: Yes    GYNE HX/STD PREVENTION/FAMILY PLANNING  GPA:  G1P1A0  Menarche age: 75  Menopause concerns: Reports None  Menstrual character: Reports absent (has IUD)  UTI in the last 74months?: No  Sexually active: Yes  Sexual Preference: Female  Hx of domestic or sexual abuse: no  Family Planning Discussed:: Yes (Mirena placed by [facility] 2017; replace in April 2025)  Gyne Surgery: No  Prior hx of  STD?: No    PAP SMEAR/HPV,CHLAMYDIA TESTING/ PELVIC  PATIENT CERVICAL CANCER RISK ASSESSMENT  Patient's Current Cervical Cancer risk: Low  Prior Cytology/HPV Results (date, facility, results):  Here for pap. Denies GYN related symptoms and no STD concerns. Has IUD--her 3rd one, placed around 2017 per pt. LMP about 15 years ago due to IUD. Due to be replaced in April 2025 by provider at [facility].    Last pap:  Per pt. on 01/19/21:  Good for 8 years with her Mirena based on new guidelines per pt--not due for replacement until April 2025. Had pap by GYN in October 2022. See scanned letter dated 01/08/21 she received from Dr. Lavena Bullion indicating that pap smear was normal.  12/24/19:  Pap:  NEGATIVE FOR INTRAEPITHELIAL LESION OR MALIGNANCY. Satisfactory for evaluation.  Endocervical and/or squamous metaplastic cells (endocervical component) are present.  08/16/2018: Pap smear: Negative.  EC component present.  07/10/17:  Negative.   See EHR  Sent to [facility] in [place] 06/2015 to have IUD placed.  09/28/12--Pap was negative but no EC component seen.    Reports history of two abnormal Pap smears in the past. Last one led to colposcopy which was okay per patient.  Further screening required: yes    HIV/HEP B/C RISK SCREENING  GUIDELINES: HIV: Offer screening ages 73-65 annually without regard to perceived risk.  Annually for those younger than 51 and older than 38 who are at increased risk.  Hep B:  Screen only high risk.  Hep C: Screen once regardless of risk, age 42-79 with HCV Ab testing with PCR confirmation for abnormal results. Screen again only if new exposure.  PATIENT HIV/HEPB/HEPC RISK ASSESSMENT  RISK ASSESSMENT: Reports Tattoos  Patient's Current Risk Status: Low Risk  SCREENING FOR HIV/HEPTB/HEPTC: declined    COLON CANCER SCREENING  Previous Screening Test:(date,test,facility,result):  Plans to see Dr. Georgiann Cocker for colonoscopy and EGD, the latter for acid reflux.  No family hx of colorectal cancer.    OSTEOPOROSIS SCREENING/TREATMENT  Risk Assessment: reports Personal Hx of smoking (present or past)  Previous Bone Density testing (Date/Findings/FRAX):  N/A  Previous and or Current pharmacologic  therapy:  N/A  Osteoporosis assessment/tx or prevention plan suggested:  Advised adequate calcium and vitamin D and weightbearing exercise daily as well as smoking cessation.    VISION/GLAUCOMA SCREENING  Last Eye Exam: (date, provider):  No family hx of glaucoma.  Glasses and contacts; Paulino Rily; last visit March 2023. Has to switch to someone in her network.       DEPRESSION SCREENING  1. Little interest or pleasure in doing things: not at all  2. Feeling down, depressed, or hopeless: not at all  PHQ-2: Total score: 0    NUTRITIONAL ASSESSMENT  Dietary habits: Reports whole grains and daily servings of fruits and vegetables  1. How many times a week did you eat fast food meals or snacks?: 1-3 times  2. How many servings of fruit did you eat each day?: 2 or less  3. How many servings of vegetables did you eat each day?: 1-3 times  4. How many nights a week do you eat suppers prepared at home?: 4 or more times  Dietary Suggestions Made:  Advised a low-fat, low-cholesterol, low-sodium diet.  She thinks her insurance does not cover weight loss shots    VITAMIN SUPPLEMENT USE  Do you take any of the following vitamins?: Reports Other (See med list.)    CALCIUM INTAKE  Dairy product consumption: Reports  Cheese    EXERCISE HABITS  Comments:  Advised 30 minutes of aerobic exercise daily and light weight training twice weekly    SKIN CANCER RISK ASSESSMENT  History of Skin Cancer: No  Fair Skin: yes  History of SunBurn: yes  Uses SunScreen: yes  Counseling Done to Use SunScreen SPF 36 or less and limit sun exposure 10am to 3 pm.: yes    ALCOHOL MISUSE SCREENING  GUIDELINES: Current for safe consumption  Men 74 and older. ALL Women: < 7 drinks per week or < 3 drinks per occasion.  How often do you have a drink containing alcohol?: 2-4 times a month  How many drinks containing alcohol do you have on a typical day?: 1 to 2  How often do you have 6 or more drinks on 1 occasion?: never  Alcohol Risk Assessment: Low    TOBACCO USE ASSESSMENT  Tobacco Status: cigarettes (1/2 ppd or less)  Tobacco Start Age: 25    CARDIOVASCULAR RISK EVALUATION  Risk Factors: Reports Family Hx of Heart Disease (Maternal grandmother died from CHF) and Current tobacco use  Treatment of patient with ASCVD over 7.5%: Declines to take meds  Last Lipid panel:Date/Results:  See EHR.    DIABETES SCREENING  Last Screening: Date/Results:  See EHR; No hx of gestational diabetes.    ASA PROPHYLAXIS  Current use of ASA: no  ASA suggested: no    ADVANCED CARE PLANNING  Advance Directives: No  Advance Directives Information Provided: Yes (Another booklet given. )    SAFETY  Car Safety: Wears a seatbelt  Home safety: Reports water heater temperature set to <120 degrees, has working Oceanographer in home and has a Data processing manager in the home  Personal safety: No concerns    CONSULTS  List of consultants: No current specialists other than GYN when IUD is due to be removed and/or replaced.       Objective:  BP 124/86 (Cuff Size: Adult Large)   Pulse 97   Temp 36.8 C (98.3 F) (Skin)   Resp 16   Ht 1.727 m (5\' 8" )   Wt 114 kg (252 lb)  SpO2 97%   BMI 38.32 kg/m     Physical Exam  Vitals reviewed. Exam conducted with a chaperone present.    Constitutional:       General: She is not in acute distress.     Appearance: She is not ill-appearing.   HENT:      Right Ear: Tympanic membrane normal.      Left Ear: Tympanic membrane normal.      Nose: Nose normal.      Mouth/Throat:      Mouth: Mucous membranes are moist.      Pharynx: Oropharynx is clear.   Eyes:      General: No scleral icterus.     Extraocular Movements: Extraocular movements intact.      Pupils: Pupils are equal, round, and reactive to light.   Neck:      Vascular: No carotid bruit.      Comments: Thyroid palpable but not enlarged or tender.   Cardiovascular:      Rate and Rhythm: Normal rate and regular rhythm.      Pulses: Normal pulses.      Heart sounds: Normal heart sounds. No murmur heard.     Comments: No abdominal bruits or pulsatile mass.   Pulmonary:      Effort: Pulmonary effort is normal.      Breath sounds: Normal breath sounds. No wheezing or rales.   Chest:   Breasts:     Breasts are symmetrical.      Right: No swelling, bleeding, mass, nipple discharge, skin change or tenderness.      Left: No swelling, bleeding, mass, nipple discharge, skin change or tenderness.   Abdominal:      General: Bowel sounds are normal.      Palpations: Abdomen is soft.      Tenderness: There is no abdominal tenderness. There is no guarding.   Genitourinary:     Exam position: Lithotomy position.      Labia:         Right: No rash or lesion.         Left: No rash or lesion.       Urethra: No prolapse.      Vagina: No vaginal discharge, erythema, bleeding or lesions.      Cervix: Normal. No cervical motion tenderness, discharge, friability, lesion, erythema or cervical bleeding.      Uterus: Not enlarged, not fixed and not tender.       Comments: Pap smear obtained today.     Unable to feel ovaries bilaterally. No tenderness during bimanual exam.   Musculoskeletal:      Cervical back: Neck supple.      Right lower leg: No edema.      Left lower leg: No edema.   Lymphadenopathy:      Cervical: No  cervical adenopathy.      Upper Body:      Right upper body: No axillary adenopathy.      Left upper body: No axillary adenopathy.   Skin:     General: Skin is warm and dry.   Neurological:      General: No focal deficit present.      Mental Status: She is alert and oriented to person, place, and time. Mental status is at baseline.      Motor: No weakness.      Gait: Gait normal.   Psychiatric:         Mood and Affect: Mood normal.  Behavior: Behavior normal.         Thought Content: Thought content normal.          Assessment/Plan:    ICD-10-CM    1. Annual physical exam  Z00.00 LIPID PANEL     HGA1C (HEMOGLOBIN A1C WITH EST AVG GLUCOSE)     VITAMIN B12      2. Pap smear, as part of routine gynecological examination  Z01.419 THIN PREP PAP REFLEX TO HPV MRNA IF ASCUS ([facility])      3. Encounter for screening for malignant neoplasm of breast, unspecified screening modality  Z12.39 MAMMO BILATERAL SCREENING      4. Insomnia, unspecified type  G47.00       5. Primary hypertension  I10 LIPID PANEL     HGA1C (HEMOGLOBIN A1C WITH EST AVG GLUCOSE)     VITAMIN B12      6. Obesity, unspecified classification, unspecified obesity type, unspecified whether serious comorbidity present  E66.9 LIPID PANEL     HGA1C (HEMOGLOBIN A1C WITH EST AVG GLUCOSE)     VITAMIN B12      7. B12 deficiency  E53.8 LIPID PANEL     HGA1C (HEMOGLOBIN A1C WITH EST AVG GLUCOSE)     VITAMIN B12          Plan      MDM    Medical decision making/counseling:  For insomnia:  Increase to Trazodone 100 mg HS. Sleep hygiene. Minimal caffeine. Await home sleep study results.   Routine recommendations as per HPI.  Await labs.  Declines screening labs for HIV and hepatitis.  Doing well on other meds. Rfs as below.   Await pap and mammogram. Yearly well woman visits advised.  IUD replacement as planned in [place].  Monthly breast self exams advised and follow-up for any new findings.    Vaccine counseling.  Declines pneumonia vaccine, Tdap, and HPV.  Advance  directives booklet provided.  Routine visits as advised.  RTC sooner if needed.  Go to ER for any severe symptoms.    New prescription medication: None    Med refills: X5 as below    Immunizations: Declined.  See above.    Nursing procedures: Lab    Labs: As ordered below    Imaging: mammogram    Referrals: No referrals    Follow-up as documented/3 mos CDM.         Tobacco cessation counseling performed. No interest in assistance with quitting at this time.   Depression screening is negative. PHQ 2 Total: 0       Data Reviewed:    02/09/2022 ordered by rheumatologist: Hepatitis-B and C negative.  WBC 8.3, HGB 15.4, HCT 44.2, platelets 265.  Glucose 94.  BUN 6, creatinine 0.99, GFR 72.  Sodium 139, potassium 3.8.  Calcium 9.5.  Total bilirubin 0.6, AST 19, ALT 22.  Routine urinalysis:  Negative protein, negative blood.  Some calcium oxalate crystals noted.  Protein electrophoresis showed no M spike.  RNP antibodies elevated.  Smith antibodies less than 0.2.  Sjogren's antibodies less than 0.2.  Complement C4 normal at 16.  Free T4 1.05, TSH 2.110.  C3 complement 137.  Rheumatoid factor less than 10.0.  Vitamin-D 21.3.  Anti dsDNA antibody 1.  Lyme negative.  Anti CCP normal at 2.  Uric acid 5.2.  TPO elevated at 60, thyroglobulin antibody less than 1.0.  CK 91.  Total complement greater than 60 and normal.  CRP 12.  ANA positive with nuclear pattern 1: 640.  See scanned copy.      Orders Placed This Encounter    MAMMO BILATERAL SCREENING    LIPID PANEL    HGA1C (HEMOGLOBIN A1C WITH EST AVG GLUCOSE)    VITAMIN B12    buPROPion (WELLBUTRIN SR) 200 mg Oral tablet sustained-release 12 hr    escitalopram oxalate (LEXAPRO) 20 mg Oral Tablet    olmesartan (BENICAR) 20 mg Oral Tablet    omeprazole (PRILOSEC) 20 mg Oral Capsule, Delayed Release(E.C.)    traZODone (DESYREL) 100 mg Oral Tablet    THIN PREP PAP REFLEX TO HPV MRNA IF ASCUS ([facility])        Current Outpatient Medications   Medication Sig    buPROPion (WELLBUTRIN SR)  200 mg Oral tablet sustained-release 12 hr Take 1 Tablet (200 mg total) by mouth Twice daily    cholecalciferol, vitamin D3, 125 mcg (5,000 unit) Oral Capsule Take 1 Capsule (5,000 Units total) by mouth Once a day    clobetasoL (TEMOVATE) 0.05 % Cream Apply topically Twice per day as needed    dicyclomine (BENTYL) 20 mg Oral Tablet Take 1 Tablet (20 mg total) by mouth Twice per day as needed    escitalopram oxalate (LEXAPRO) 20 mg Oral Tablet Take 1 Tablet (20 mg total) by mouth Once a day    levonorgestrel (MIRENA) 20 mcg/24 hr intrauterine device by Intrauterine route One time    olmesartan (BENICAR) 20 mg Oral Tablet Take 1 Tablet (20 mg total) by mouth Once a day    omeprazole (PRILOSEC) 20 mg Oral Capsule, Delayed Release(E.C.) Take 1 Capsule (20 mg total) by mouth Once a day    traZODone (DESYREL) 50 mg Oral Tablet 1/2 to 1 po HS for sleep (Patient taking differently: 1 Tablet (50 mg total) 1/2 to 1 po HS for sleep)        Return in about 3 months (around 07/20/2022) for for routine follow up, In Person Visit.    Angelica Chessman, PA-C

## 2022-04-21 NOTE — Nursing Note (Signed)
Pt here for well woman   Andrea Meigs, LPN

## 2022-04-21 NOTE — Nursing Note (Signed)
04/21/22 1416   Depression Screen   Little interest or pleasure in doing things. 0   Feeling down, depressed, or hopeless 0   PHQ 2 Total 0

## 2022-04-22 LAB — HGA1C (HEMOGLOBIN A1C WITH EST AVG GLUCOSE): HEMOGLOBIN A1C: 5.7 % (ref 4.0–6.0)

## 2022-04-27 LAB — THIN PREP PAP REFLEX TO HPV MRNA IF ASCUS (QUEST)

## 2022-04-28 ENCOUNTER — Other Ambulatory Visit: Payer: Self-pay

## 2022-04-28 ENCOUNTER — Inpatient Hospital Stay
Admission: RE | Admit: 2022-04-28 | Discharge: 2022-04-28 | Disposition: A | Discharge status: Home / Self Care | Payer: 59 | Source: Ambulatory Visit | Attending: Physician Assistant | Admitting: Physician Assistant

## 2022-04-28 ENCOUNTER — Encounter (HOSPITAL_COMMUNITY): Payer: Self-pay

## 2022-04-28 DIAGNOSIS — Z1239 Encounter for other screening for malignant neoplasm of breast: Secondary | ICD-10-CM

## 2022-04-28 DIAGNOSIS — Z1231 Encounter for screening mammogram for malignant neoplasm of breast: Secondary | ICD-10-CM | POA: Insufficient documentation

## 2022-04-29 ENCOUNTER — Encounter (RURAL_HEALTH_CENTER): Payer: Self-pay | Admitting: Physician Assistant

## 2022-07-20 ENCOUNTER — Ambulatory Visit (RURAL_HEALTH_CENTER): Payer: Self-pay | Admitting: Physician Assistant

## 2022-08-01 ENCOUNTER — Other Ambulatory Visit: Payer: Self-pay

## 2022-08-01 ENCOUNTER — Encounter (RURAL_HEALTH_CENTER): Payer: Self-pay | Admitting: Physician Assistant

## 2022-08-01 ENCOUNTER — Ambulatory Visit (RURAL_HEALTH_CENTER): Payer: 59 | Attending: Physician Assistant | Admitting: Physician Assistant

## 2022-08-01 VITALS — BP 100/68 | HR 95 | Temp 98.0°F | Resp 16 | Wt 250.4 lb

## 2022-08-01 DIAGNOSIS — K219 Gastro-esophageal reflux disease without esophagitis: Secondary | ICD-10-CM

## 2022-08-01 DIAGNOSIS — E782 Mixed hyperlipidemia: Secondary | ICD-10-CM | POA: Insufficient documentation

## 2022-08-01 DIAGNOSIS — F17210 Nicotine dependence, cigarettes, uncomplicated: Secondary | ICD-10-CM | POA: Insufficient documentation

## 2022-08-01 DIAGNOSIS — G4700 Insomnia, unspecified: Secondary | ICD-10-CM | POA: Insufficient documentation

## 2022-08-01 DIAGNOSIS — R7303 Prediabetes: Secondary | ICD-10-CM

## 2022-08-01 DIAGNOSIS — Z79899 Other long term (current) drug therapy: Secondary | ICD-10-CM | POA: Insufficient documentation

## 2022-08-01 DIAGNOSIS — L309 Dermatitis, unspecified: Secondary | ICD-10-CM | POA: Insufficient documentation

## 2022-08-01 DIAGNOSIS — F419 Anxiety disorder, unspecified: Secondary | ICD-10-CM

## 2022-08-01 DIAGNOSIS — I10 Essential (primary) hypertension: Secondary | ICD-10-CM

## 2022-08-01 DIAGNOSIS — M2550 Pain in unspecified joint: Secondary | ICD-10-CM | POA: Insufficient documentation

## 2022-08-01 DIAGNOSIS — F17200 Nicotine dependence, unspecified, uncomplicated: Secondary | ICD-10-CM

## 2022-08-01 MED ORDER — TRAZODONE 100 MG TABLET
ORAL_TABLET | ORAL | 1 refills | Status: DC
Start: 2022-08-01 — End: 2023-02-16

## 2022-08-01 NOTE — Progress Notes (Signed)
[facility]  FAMILY MEDICINE, [facility]  Operated by [facility]  Progress Note    Name: Andrea Hodges MRN:  [id number]   Date: 08/01/2022 Age: 47 y.o.          Chief complaint: Medication Check (3 months CDM no changes no new issues. )       This 47 year old white female presents today for follow up. Discussed labs and new diagnosis of pre-diabetes. Previous right hand symptoms resolved. See last visit.      Some prior records were brought forward in order to continue completeness and accuracy of the medical record. Changes were made on this visit to reflect current status.      NEW:  Pre-diabetes  08/01/22:  Asymptomatic. Encouraged dietary changes.         Sleeping difficulty; hypersomnia:    Symptoms:    08/01/22:  The higher dose of med is "fabulous" per patient.  Stays asleep longer. Getting around 6 to 8 hours of sleep at night.  Did not complete home sleep study and prefers to table that for now.    Meds:    Trazodone 100 mg HS.  Failed on OTC Melatonin.   Investigations:    Decided not to complete home sleep study to rule out OSA as of 08/01/22.        HTN:  BP readings at work:    08/01/22:  Has not checked it. Feels like its' been okay.     symptoms:  08/01/22:  She denies any chest pain, shortness of breath, palpitations, dizziness, near-syncope, or syncope.  No edema.    meds:   Benicar 20 mg 1 p.o. daily.    Stopped Lisinopril 10 due to cough.   Last eye exam:    Jan. 10, 2023 with pressure check but no dilation. Smiley Houseman.   investigations:   Will need routine EKG in the future as well.   labs:   See EHR.      Mixed hyperlipidemia:  diet/exercise:   Discussed importance of both and discussed ASCVD risk score. She is not closely watching diet or exercising; room for improvement per pt.   meds:   no current meds  labs:   See EHR      GERD:  onset:   on meds since late 2019  symptoms:   08/01/22:  No breakthrough symptoms. Denies abdominal pain, bloody or  black looking stool. No heartburn, reflux or indigestion.  meds:   Omeprazole 20 mg 1 po QD.  referrals:   none  investigations:   no hx of EGD        Mood swings and Anxiety:  onset:  years ago  symptoms:   08/01/22  Anxiety remains stable on current meds and mood as well . No changes needed. Doing well on current meds. No SI or HI.   Meds:   Wellbutrin SR 200 mg 1 po BID.  Lexapro 20 mg QD.     Seroquel increased appetite which caused weight gain. Cymbalta was too expensive in the past. Effexor did not work and kept her awake for 32 hours. Has also tried Zoloft, Prozac, Celexa, and Buspar)  Referrals:   No current referrals.        Eczema:  onset:  off and on for several years  symptoms:   08/01/22:  No concerns voiced.    Meds:   Clobetasol 0.05% cream PRN. Previously, on Triamcinolone 0.5% BID PRN. It  has worked better than the Betamethasone 0.05% steroid cream.  referrals:   None currently needed                      Vitamin D and hx of Vit B12 deficiency:   meds:   Per patient on 03/02/22:  Had labs ordered by Dr. Ninfa Linden 02/08/22. Vitamin D was low. Told to get OTC Vit D 5000 units until discuss all results 03/15/22.   On 06/02/21:  Reminded her to start B12 1000 mcg daily and Vit D 2000 units daily. Only on MVI daily. Previously, OTC Vitamin D 5000 units daily.  labs:   See EHR        Tobacco Use:  08/01/22:  Is smoking 1/4 ppd on average. Rarely coughs. Not yet ready to quit. Afraid of gaining weight. Declines referral. We discussed the potential risks of continued smoking. Quit years ago for almost 2 years but then started smoking again. Has over a 20 year hx of smoking.          Additional lab review:     02/09/2022 ordered by rheumatologist: Hepatitis-B and C negative.  WBC 8.3, HGB 15.4, HCT 44.2, platelets 265.  Glucose 94.  BUN 6, creatinine 0.99, GFR 72.  Sodium 139, potassium 3.8.  Calcium 9.5.  Total bilirubin 0.6, AST 19, ALT 22.  Routine urinalysis:  Negative protein, negative blood.  Some calcium  oxalate crystals noted.  Protein electrophoresis showed no M spike.  RNP antibodies elevated.  Smith antibodies less than 0.2.  Sjogren's antibodies less than 0.2.  Complement C4 normal at 16.  Free T4 1.05, TSH 2.110.  C3 complement 137.  Rheumatoid factor less than 10.0.  Vitamin-D 21.3.  Anti dsDNA antibody 1.  Lyme negative.  Anti CCP normal at 2.  Uric acid 5.2.  TPO elevated at 60, thyroglobulin antibody less than 1.0.  CK 91.  Total complement greater than 60 and normal.  CRP 12.  ANA positive with nuclear pattern 1: 640.  See scanned copy.    Per patient on 03/02/22:  cancelled her consultation for colonoscopy. Plans to reschedule at some point.  As of 03/02/21:  Prefers to postpone GB imaging since better. [US liver gb and duct- hida scan if needed scheduled for 02/15/21 at 7:00am was cancelled. ]        Last pap:    04/21/22; also, due for Mirena replacement April 2025 (8 year mark).   Had pap by GYN in October 2022.   12/24/19, 08/16/2018, 07/10/17        Mammogram:  03/02/21:  Mammogram ordered by Dr. Clint Guy:  Negative.       Specialists:    Dr. Ninfa Linden due to + ANA and RNP.  Feels she likely has OA instead of RA. Ortho dx her with OA in the past. Did see Dr. Lauro Franklin in the past as well.      Immunizations:  [employer] Covid vaccine 05/28/20 at [employer]; yearly flu shots advised . She declined Pneumo 23, Tdap and HPV during well visit.         Objective:  BP 100/68 (Site: Left, Patient Position: Sitting, Cuff Size: Adult)   Pulse 95   Temp 36.7 C (98 F) (Skin)   Resp 16   Wt 114 kg (250 lb 6 oz)   SpO2 95%   BMI 38.07 kg/m     General:  Cooperative.  No acute distress.  Alert and oriented x3.  Eyes:  Sclera normal.    Neck: Supple.  No cervical lymphadenopathy.  Thyroid palpable but not enlarged or tender.    Lungs:  Normal respiratory effort.  Clear to auscultation bilaterally.    Heart: Regular rate and regular rhythm.  S1 and S2 normal.  No definite murmur.  No carotid bruits.  No abdominal bruits or  pulsatile mass.  Pedal pulses palpable.  No significant swelling of ankles.    Abdomen:  Soft.  Bowel sounds present all quadrants.  Nontender.  No hepatosplenomegaly.    Musculoskeletal:  No finger joint deformities. FROM neck and bilateral upper extremities.No focal tenderness. Radial pulses 2+. Brisk capillary refill.     Skin:  Warm and dry.  No current eczema.    Neuro:  Sensation to light touch intact both upper and lower extremities.    Psych:  Speech and movement normal.  Normal affect.  Thought process and thought content normal.        Assessment/Plan:    ICD-10-CM    1. Pre-diabetes  R73.03       2. Primary hypertension  I10       3. Mixed hyperlipidemia  E78.2       4. Anxiety  F41.9       5. Insomnia, unspecified type  G47.00       6. Gastroesophageal reflux disease, unspecified whether esophagitis present  K21.9       7. Tobacco use disorder  F17.200         Counseling Done:  Discussed labs. RF x 1. Others not yet due.     Insomnia: Chronic, stable. Continue Trazodone 100 mg po HS. Sleep hygiene discussed. Minimize caffeine.She decided against home sleep study to rule out OSA.    Hypertension: Chronic, stable. Continue Benicar 20 mg 1 po QD.  Monitor BP, log, and return to clinic if staying greater than 140/90, either parameter on average.  Bring log of readings for my review to future visits.  She understands the potential risks of uncontrolled BP.  Low-sodium diet.  Weight loss. Go to ER for any cardiovascular symptoms.     Mixed hyperlipidemia:  Low cholesterol diet. Exercise. Discussed ASCVD risk score of 4.3%. Since < 5, no statin at this time. Monitor labs.     Pre-diabetes:  Encouraged diabetic diet with decreased sugar, white starchy carbs. Exercise. Lose weight. Monitor.     Anxiety and mood swings: Chronic, stable. Continue Wellbutrin SR 200 mg BID. Continue Lexapro 20 mg daily.  Monitor. Declines counseling referral. Return to clinic if this persists or becomes worse. Go to ER for any  suicidal or homicidal thoughts.    GERD:  Stable. Continue Omeprazole 20 mg daily. Routine recommendations.     Tobacco abuse:  Advised smoking cessation and she understands the potential risks of continued smoking. She declines referral/assistance at this time.    Eczema:  Stable. Continue PRN use of  Clobetasol 0.05% cream topically as directed. F/U if not improving or worse.     Positive ANA; arthralgia:  Continue follow up visits with Dr. Ninfa Linden . Symptomatic measures/supportive care as discussed.    Yearly well-woman/check-ups advised.      Follow up as below-6 months- but if any worsening, incomplete resolution, or new problems occur, patient is to return sooner.      Tobacco cessation counseling performed.  She is not interested in meds for smoking cessation and/or referral at this time. Accepts risks of continued smoking.  Orders Placed This Encounter    traZODone (DESYREL) 100 mg Oral Tablet       Current Outpatient Medications   Medication Sig    buPROPion (WELLBUTRIN SR) 200 mg Oral tablet sustained-release 12 hr Take 1 Tablet (200 mg total) by mouth Twice daily    cholecalciferol, vitamin D3, 125 mcg (5,000 unit) Oral Capsule Take 1 Capsule (5,000 Units total) by mouth Once a day    clobetasoL (TEMOVATE) 0.05 % Cream Apply topically Twice per day as needed    dicyclomine (BENTYL) 20 mg Oral Tablet Take 1 Tablet (20 mg total) by mouth Twice per day as needed    escitalopram oxalate (LEXAPRO) 20 mg Oral Tablet Take 1 Tablet (20 mg total) by mouth Once a day    levonorgestrel (MIRENA) 20 mcg/24 hr intrauterine device by Intrauterine route One time    olmesartan (BENICAR) 20 mg Oral Tablet Take 1 Tablet (20 mg total) by mouth Once a day    omeprazole (PRILOSEC) 20 mg Oral Capsule, Delayed Release(E.C.) Take 1 Capsule (20 mg total) by mouth Once a day    traZODone (DESYREL) 100 mg Oral Tablet 1 po HS for sleep        Return in about 6 months (around 02/01/2023) for for routine follow up.    Ashby Dawes, PA-C

## 2022-09-19 ENCOUNTER — Other Ambulatory Visit (RURAL_HEALTH_CENTER): Payer: Self-pay | Admitting: Physician Assistant

## 2022-12-19 ENCOUNTER — Other Ambulatory Visit (RURAL_HEALTH_CENTER): Payer: Self-pay | Admitting: Physician Assistant

## 2022-12-23 ENCOUNTER — Other Ambulatory Visit (RURAL_HEALTH_CENTER): Payer: Self-pay | Admitting: Physician Assistant

## 2022-12-27 ENCOUNTER — Encounter (RURAL_HEALTH_CENTER): Payer: Self-pay | Admitting: Physician Assistant

## 2022-12-27 ENCOUNTER — Other Ambulatory Visit (RURAL_HEALTH_CENTER): Payer: Self-pay | Admitting: Physician Assistant

## 2022-12-27 NOTE — Nursing Note (Signed)
Patient called for Wellbutrin refill but looks like last refill was 04/21/22 for a 90 day supply please advise.

## 2022-12-29 ENCOUNTER — Other Ambulatory Visit (RURAL_HEALTH_CENTER): Payer: Self-pay | Admitting: Physician Assistant

## 2022-12-30 ENCOUNTER — Other Ambulatory Visit (RURAL_HEALTH_CENTER): Payer: Self-pay | Admitting: Physician Assistant

## 2022-12-30 MED ORDER — BUPROPION HCL SR 200 MG TABLET,12 HR SUSTAINED-RELEASE
200.0000 mg | ORAL_TABLET | Freq: Two times a day (BID) | ORAL | 0 refills | Status: DC
Start: 2022-12-30 — End: 2023-02-16

## 2023-01-10 ENCOUNTER — Encounter (HOSPITAL_BASED_OUTPATIENT_CLINIC_OR_DEPARTMENT_OTHER): Payer: Self-pay

## 2023-01-10 ENCOUNTER — Emergency Department (HOSPITAL_BASED_OUTPATIENT_CLINIC_OR_DEPARTMENT_OTHER): Payer: 59

## 2023-01-10 ENCOUNTER — Other Ambulatory Visit: Payer: Self-pay

## 2023-01-10 ENCOUNTER — Emergency Department
Admission: EM | Admit: 2023-01-10 | Discharge: 2023-01-10 | Disposition: A | Discharge status: Home / Self Care | Payer: 59 | Attending: Emergency Medicine | Admitting: Emergency Medicine

## 2023-01-10 DIAGNOSIS — S86912A Strain of unspecified muscle(s) and tendon(s) at lower leg level, left leg, initial encounter: Secondary | ICD-10-CM | POA: Insufficient documentation

## 2023-01-10 DIAGNOSIS — M1712 Unilateral primary osteoarthritis, left knee: Secondary | ICD-10-CM | POA: Insufficient documentation

## 2023-01-10 DIAGNOSIS — Z6835 Body mass index (BMI) 35.0-35.9, adult: Secondary | ICD-10-CM | POA: Insufficient documentation

## 2023-01-10 DIAGNOSIS — X58XXXA Exposure to other specified factors, initial encounter: Secondary | ICD-10-CM | POA: Insufficient documentation

## 2023-01-10 DIAGNOSIS — E669 Obesity, unspecified: Secondary | ICD-10-CM | POA: Insufficient documentation

## 2023-01-10 HISTORY — DX: Unspecified osteoarthritis, unspecified site: M19.90

## 2023-01-10 MED ORDER — TRAMADOL 50 MG TABLET
1.0000 | ORAL_TABLET | Freq: Four times a day (QID) | ORAL | 0 refills | Status: AC | PRN
Start: 2023-01-10 — End: 2023-01-20

## 2023-01-10 MED ORDER — TRAMADOL 50 MG TABLET
ORAL_TABLET | ORAL | Status: AC
Start: 2023-01-10 — End: 2023-01-10
  Filled 2023-01-10: qty 1

## 2023-01-10 MED ORDER — TRAMADOL 50 MG TABLET
50.0000 mg | ORAL_TABLET | ORAL | Status: AC
Start: 2023-01-10 — End: 2023-01-10
  Administered 2023-01-10: 50 mg via ORAL

## 2023-01-10 NOTE — ED Triage Notes (Signed)
Was sitting in chair at work.  States upon standing  heard a loud pop in left knee.  Reports painful initially and feels something moving in knee.  States feels tight.  Tightness with flexion.

## 2023-01-10 NOTE — ED Nurses Note (Signed)
Patient discharged home all instructions and test results gone over with patient including medications with verbalized understanding. Patient educated on how to apply knee immobilizer.Patient ambulated off unit independently.

## 2023-01-10 NOTE — ED Provider Notes (Signed)
[facility] - Emergency Department  ED Primary Provider Note  History of Present Illness   Chief Complaint   Patient presents with    Knee Pain     Andrea Hodges is a 47 y.o. female who had concerns including Knee Pain.  Arrival: The patient arrived by Car complaining of standing up and feeling something pop in her left knee.  Since then she has been feeling swelling in the top of the knee and pain in her lateral meniscus.  She feels like something is floating inside.  She states she can weightbear on it without any pain.    HPI  Review of Systems   Review of Systems   Constitutional:  Positive for activity change and appetite change. Negative for chills and fever.   HENT:  Negative for ear pain and sore throat.    Eyes:  Negative for pain and visual disturbance.   Respiratory:  Negative for cough and shortness of breath.    Cardiovascular:  Negative for chest pain and palpitations.   Gastrointestinal:  Negative for abdominal pain and vomiting.   Genitourinary:  Negative for dysuria and hematuria.   Musculoskeletal:  Positive for arthralgias, gait problem, joint swelling and myalgias. Negative for back pain.   Skin:  Negative for color change and rash.   Neurological:  Negative for seizures and syncope.   All other systems reviewed and are negative.     Historical Data   History Reviewed This Encounter:     Physical Exam   ED Triage Vitals [01/10/23 1734]   BP (Non-Invasive) 120/82   Heart Rate 96   Respiratory Rate 18   Temperature 37.3 C (99.1 F)   SpO2 95 %   Weight 109 kg (240 lb)   Height 1.753 m (5\' 9" )     Physical Exam  Vitals and nursing note reviewed.   Constitutional:       General: She is not in acute distress.     Appearance: She is well-developed. She is obese.   HENT:      Head: Normocephalic and atraumatic.      Right Ear: External ear normal.      Left Ear: External ear normal.      Nose: Nose normal.      Mouth/Throat:      Mouth: Mucous membranes are dry.   Eyes:       Extraocular Movements: Extraocular movements intact.      Conjunctiva/sclera: Conjunctivae normal.      Pupils: Pupils are equal, round, and reactive to light.   Cardiovascular:      Rate and Rhythm: Normal rate and regular rhythm.      Pulses: Normal pulses.      Heart sounds: Normal heart sounds. No murmur heard.  Pulmonary:      Effort: Pulmonary effort is normal. No respiratory distress.      Breath sounds: Normal breath sounds.   Abdominal:      General: Bowel sounds are normal.      Palpations: Abdomen is soft.      Tenderness: There is no abdominal tenderness.   Musculoskeletal:         General: Swelling and tenderness present.      Cervical back: Normal range of motion and neck supple.      Comments: Positive tenderness over the lateral aspect of the left knee with small suprapatellar effusion.  No ecchymosis.  No laxity of the joint.   Skin:  General: Skin is warm and dry.      Capillary Refill: Capillary refill takes less than 2 seconds.   Neurological:      General: No focal deficit present.      Mental Status: She is alert and oriented to person, place, and time.   Psychiatric:         Mood and Affect: Mood normal.         Behavior: Behavior normal.         Thought Content: Thought content normal.       Patient Data   Labs Ordered/Reviewed - No data to display  XR KNEE LEFT 4 OR MORE VIEWS   Final Result by Edi, Radresults In (10/29 1826)   DEGENERATIVE OSTEOARTHROSIS. NO ACUTE FINDINGS.                Radiologist location ID: [id number]           Medical Decision Making        Medical Decision Making  Patient is 67 old white female complaining feeling popping sensation in her knee when she stood up from a chair.  Patient states it feels like something is floating in her knee with swelling.  She denies any severe pain in her knee.  Patient denies any other pain.  Patient denies twisting her knee prior to feeling the popping.  Patient will have an x-ray of her knee then be placed in a knee  immobilizer.  Patient will be given analgesia and treated for results.  Patient will be given Orthopedics name to follow up with in the next 2-3 days.    Amount and/or Complexity of Data Reviewed  Radiology: ordered.    Risk  Prescription drug management.             Medications Administered in the ED   traMADol (ULTRAM) tablet (has no administration in time range)     Clinical Impression   Strain of left knee, initial encounter (Primary)       Disposition: Discharged               Clinical Impression   Strain of left knee, initial encounter (Primary)       Current Discharge Medication List        START taking these medications    Details   traMADoL (ULTRAM) 50 mg Oral Tablet Take 1 Tablet (50 mg total) by mouth Every 6 hours as needed for Pain for up to 10 days  Qty: 20 Tablet, Refills: 0

## 2023-01-16 ENCOUNTER — Ambulatory Visit (RURAL_HEALTH_CENTER): Payer: 59 | Attending: Physician Assistant | Admitting: Physician Assistant

## 2023-01-16 ENCOUNTER — Other Ambulatory Visit: Payer: Self-pay

## 2023-01-16 ENCOUNTER — Encounter (RURAL_HEALTH_CENTER): Payer: Self-pay | Admitting: Physician Assistant

## 2023-01-16 VITALS — BP 106/77 | HR 101 | Temp 97.9°F | Resp 16 | Wt 237.0 lb

## 2023-01-16 DIAGNOSIS — M25362 Other instability, left knee: Secondary | ICD-10-CM | POA: Insufficient documentation

## 2023-01-16 DIAGNOSIS — M25462 Effusion, left knee: Secondary | ICD-10-CM | POA: Insufficient documentation

## 2023-01-16 DIAGNOSIS — Z23 Encounter for immunization: Secondary | ICD-10-CM | POA: Insufficient documentation

## 2023-01-16 DIAGNOSIS — M25562 Pain in left knee: Secondary | ICD-10-CM | POA: Insufficient documentation

## 2023-01-16 DIAGNOSIS — M1712 Unilateral primary osteoarthritis, left knee: Secondary | ICD-10-CM | POA: Insufficient documentation

## 2023-01-16 NOTE — Nursing Note (Signed)
Stood up from desk last Tuesday and hit the desk due to her left knee giving out on her. Went to [facility] ER. Can feel something moving around in there and grinding .

## 2023-01-16 NOTE — Progress Notes (Signed)
FAMILY MEDICINE, [facility]  [address] [place]  [place] [address]  Operated by [facility]  Progress Note    Name: Andrea Hodges MRN:  [id number]   Date: 01/16/2023 DOB:  [birth year] (47 y.o.)              Chief complaint: ED Follow-up      Nursing Notes:   Chelsea Aus, LPN  11/91/47 [id number]  Signed  Stood up from desk last Tuesday and hit the desk due to her left knee giving out on her. Went to [facility] ER. Can feel something moving around in there and grinding .         Marcile presents today with left knee pain.       Left knee pain:    01/16/23:  Prior to last week, had been okay. Says she stood up from her desk last Tuesday and something popped loudly in her knee causing it to give out on her and she landed on the desk. Had a quick sharp shooting pain in her kneecap at that time. Went to [facility] ER on 01/10/23. Says her knee started swelling quickly and she had pain in the top and lateral aspect of her knee and describes sensation of something moving around in her knee and grinding and it feels really tight inside the knee as well. Had knee XR in ER and was given Tramadol during that visit as well as a Rx for Tramadol 50 mg to take Q 6 hours PRN #20 0 RF but she did not fill it since she's only in pain with certain movements. Was given a knee immobilizer and wore it Wed, Thursday and Friday at work. Tried to walk around some on Saturday and then it woke her up aching yesterday morning. Then, around 1 PM yesterday, about gave out on her again and it feels tight in the back of her knee and it feels like it wants to straighten/hyperextend on its own which is painful. Has hx of left knee issues and was evaluated by Dr. Melida Quitter years ago. ER provider suggested an MRI. Continues to wear her immobilizer at work.   Imaging:    01/10/23:  XR left knee  Referrals:    Will also proceed with referral to ortho for further evaluation/treatment.     She would like a flu shot today as  well.     Objective:  BP 106/77   Pulse (!) 101   Temp 36.6 C (97.9 F) (Skin)   Resp 16   Wt 108 kg (237 lb)   SpO2 95%   BMI 35.00 kg/m     Physical Exam  Vitals and nursing note reviewed.   Constitutional:       General: She is not in acute distress.  Pulmonary:      Effort: Pulmonary effort is normal.   Musculoskeletal:      Left knee: Swelling, bony tenderness and crepitus present. No erythema or ecchymosis. Decreased range of motion. Tenderness present over the lateral joint line. Normal pulse.      Comments: Unable to adequately perform routine testing for internal derangement due to pain and inability to position.   Skin:     General: Skin is warm and dry.   Neurological:      Mental Status: She is alert.      Gait: Gait abnormal (Limping due to left knee pain.).          Assessment/Plan:  ICD-10-CM    1. Pain and swelling of left knee  M25.562 MRI KNEE LEFT WO CONTRAST    M25.462 Referral to External Provider      2. Knee instability, left  M25.362 MRI KNEE LEFT WO CONTRAST     Referral to External Provider      3. Primary osteoarthritis of left knee  M17.12 MRI KNEE LEFT WO CONTRAST     Referral to External Provider      4. Needs flu shot  Z23 Flu Vaccine,6 month-adult,0.5 mL IM (Admin)        Medical Decision Making/Counseling:      Reviewed ER reports and XR left knee report and discussed with patient. MRI is needed for further evaluation. Must rule out internal derangement. Get MRI left knee without contrast at [facility] (preferred facility) . In the meantime, routine measures/supportive care including OTC Aleve (her preference) as directed with food as well as brace. Also, refer to ortho:  per her insurance App:  [facility] [address] in [place]:  Dr. Jessy Oto, MD or Dr. Verner Mould or Dr. Milinda Pointer. All in network.  Await ortho recommendations.    RTC otherwise PRN and as scheduled.        Data Reviewed:  ER visit from 01/10/23, including imaging reports and provider's  note    Orders Placed This Encounter    MRI KNEE LEFT WO CONTRAST    Flu Vaccine,6 month-adult,0.5 mL IM (Admin)    Referral to External Provider          Current Outpatient Medications   Medication Sig    buPROPion (WELLBUTRIN SR) 200 mg Oral tablet sustained-release 12 hr Take 1 Tablet (200 mg total) by mouth Twice daily    clobetasoL (TEMOVATE) 0.05 % Cream Apply topically Twice per day as needed    escitalopram oxalate (LEXAPRO) 20 mg Oral Tablet Take 1 tablet by mouth once daily    levonorgestrel (MIRENA) 20 mcg/24 hr intrauterine device by Intrauterine route One time    olmesartan (BENICAR) 20 mg Oral Tablet Take 1 tablet by mouth once daily    omeprazole (PRILOSEC) 20 mg Oral Capsule, Delayed Release(E.C.) Take 1 capsule by mouth once daily    traMADoL (ULTRAM) 50 mg Oral Tablet Take 1 Tablet (50 mg total) by mouth Every 6 hours as needed for Pain for up to 10 days (Patient not taking: Reported on 01/16/2023)    traZODone (DESYREL) 100 mg Oral Tablet 1 po HS for sleep        Return for PRN and as scheduled.    Ashby Dawes, PA-C

## 2023-01-30 ENCOUNTER — Ambulatory Visit (RURAL_HEALTH_CENTER): Payer: Self-pay | Admitting: Physician Assistant

## 2023-02-06 ENCOUNTER — Telehealth (RURAL_HEALTH_CENTER): Payer: Self-pay | Admitting: Physician Assistant

## 2023-02-06 NOTE — Telephone Encounter (Signed)
Reviewed. Vegas Coffin, PA-C

## 2023-02-06 NOTE — Telephone Encounter (Signed)
Andrea Hodges has an appt 02/24/2023 @ 2:40 at [facility] in the [employer] office. I left patient a voicemail with appt info.  [facility]  Phone# [phone]  Fax# [phone]

## 2023-02-16 ENCOUNTER — Encounter (RURAL_HEALTH_CENTER): Payer: Self-pay | Admitting: Physician Assistant

## 2023-02-16 ENCOUNTER — Ambulatory Visit (RURAL_HEALTH_CENTER): Payer: 59 | Attending: Physician Assistant | Admitting: Physician Assistant

## 2023-02-16 ENCOUNTER — Other Ambulatory Visit: Payer: Self-pay

## 2023-02-16 VITALS — BP 107/78 | HR 97 | Temp 97.8°F | Resp 16 | Wt 238.0 lb

## 2023-02-16 DIAGNOSIS — M25362 Other instability, left knee: Secondary | ICD-10-CM | POA: Insufficient documentation

## 2023-02-16 DIAGNOSIS — M1712 Unilateral primary osteoarthritis, left knee: Secondary | ICD-10-CM | POA: Insufficient documentation

## 2023-02-16 DIAGNOSIS — I10 Essential (primary) hypertension: Secondary | ICD-10-CM | POA: Insufficient documentation

## 2023-02-16 DIAGNOSIS — K219 Gastro-esophageal reflux disease without esophagitis: Secondary | ICD-10-CM | POA: Insufficient documentation

## 2023-02-16 DIAGNOSIS — F17200 Nicotine dependence, unspecified, uncomplicated: Secondary | ICD-10-CM

## 2023-02-16 DIAGNOSIS — R7303 Prediabetes: Secondary | ICD-10-CM | POA: Insufficient documentation

## 2023-02-16 DIAGNOSIS — F419 Anxiety disorder, unspecified: Secondary | ICD-10-CM | POA: Insufficient documentation

## 2023-02-16 DIAGNOSIS — M25562 Pain in left knee: Secondary | ICD-10-CM

## 2023-02-16 DIAGNOSIS — Z136 Encounter for screening for cardiovascular disorders: Secondary | ICD-10-CM

## 2023-02-16 DIAGNOSIS — E782 Mixed hyperlipidemia: Secondary | ICD-10-CM | POA: Insufficient documentation

## 2023-02-16 DIAGNOSIS — Z79899 Other long term (current) drug therapy: Secondary | ICD-10-CM | POA: Insufficient documentation

## 2023-02-16 DIAGNOSIS — G4700 Insomnia, unspecified: Secondary | ICD-10-CM | POA: Insufficient documentation

## 2023-02-16 DIAGNOSIS — Z1239 Encounter for other screening for malignant neoplasm of breast: Secondary | ICD-10-CM

## 2023-02-16 MED ORDER — OLMESARTAN 20 MG TABLET
20.0000 mg | ORAL_TABLET | Freq: Every day | ORAL | 1 refills | Status: DC
Start: 2023-02-16 — End: 2023-08-30

## 2023-02-16 MED ORDER — ESCITALOPRAM 20 MG TABLET
20.0000 mg | ORAL_TABLET | Freq: Every day | ORAL | 1 refills | Status: DC
Start: 2023-02-16 — End: 2023-08-30

## 2023-02-16 MED ORDER — TRAZODONE 100 MG TABLET
ORAL_TABLET | ORAL | 1 refills | Status: DC
Start: 2023-02-16 — End: 2023-08-30

## 2023-02-16 MED ORDER — BUPROPION HCL SR 200 MG TABLET,12 HR SUSTAINED-RELEASE
200.0000 mg | ORAL_TABLET | Freq: Two times a day (BID) | ORAL | 1 refills | Status: DC
Start: 2023-02-16 — End: 2023-08-30

## 2023-02-16 MED ORDER — OMEPRAZOLE 20 MG CAPSULE,DELAYED RELEASE
20.0000 mg | DELAYED_RELEASE_CAPSULE | Freq: Every day | ORAL | 1 refills | Status: DC
Start: 2023-02-16 — End: 2023-08-30

## 2023-02-16 NOTE — Progress Notes (Signed)
Medicine  FAMILY MEDICINE, [facility]  Operated by [facility]  Progress Note    Name: Andrea Hodges MRN:  [id number]   Date: 02/16/2023 Age: 47 y.o.          Chief complaint: Knee Pain (Left knee still hurts but is no worse )       This 47 year old white female presents today for follow up.      Some prior records were brought forward in order to continue completeness and accuracy of the medical record. Changes were made on this visit to reflect current status.      Left knee pain:    02/19/23:  Not any worse but not any better. Still unable to straighten it out. Tightness now when she walks behind the knee. Still feels it on the inside and still feels like it's trying to give out on her. Popping sound again over the weekend. Ortho appointment is 02/24/23 and MRI date/time/approval from November's order is still pending.   From 01/16/23:  Prior to last week, had been okay. Says she stood up from her desk last Tuesday and something popped loudly in her knee causing it to give out on her and she landed on the desk. Had a quick sharp shooting pain in her kneecap at that time. Went to [place] [facility] on 01/10/23. Says her knee started swelling quickly and she had pain in the top and lateral aspect of her knee and describes sensation of something moving around in her knee and grinding and it feels really tight inside the knee as well. Had knee XR in ER and was given Tramadol during that visit as well as a Rx for Tramadol 50 mg to take Q 6 hours PRN #20 0 RF but she did not fill it since she's only in pain with certain movements. Was given a knee immobilizer and wore it Wed, Thursday and Friday at work. Tried to walk around some on Saturday and then it woke her up aching yesterday morning. Then, around 1 PM yesterday, about gave out on her again and it feels tight in the back of her knee and it feels like it wants to straighten/hyperextend on its own which is painful. Has hx of  left knee issues and was evaluated by Dr. Melida Quitter years ago. ER provider suggested an MRI. Continues to wear her immobilizer at work.   Imaging:    As of today:  MRI date/time/approval from November's order is still pending.   01/10/23:  XR left knee  Referrals:    From our referral, ortho appointment is 02/24/23.       Pre-diabetes  02/16/23, 08/01/22:  Asymptomatic. Encouraged dietary changes.         Sleeping difficulty; hypersomnia:    Symptoms:    02/16/23:  Feels like she got some sleep now upon awakening. Pleased with meds.   From 08/01/22:  The higher dose of med is "fabulous" per patient.  Stays asleep longer. Getting around 6 to 8 hours of sleep at night.  Did not complete home sleep study and prefers to table that for now.    Meds:    Trazodone 100 mg HS.  Failed on OTC Melatonin.   Investigations:    Decided not to complete home sleep study to rule out OSA as of 08/01/22.        HTN:  BP readings at work:    02/16/23, 08/01/22:  Has not checked it. Feels  like it's been okay.     symptoms:  02/16/23, 08/01/22:  She denies any chest pain, shortness of breath, palpitations, dizziness, near-syncope, or syncope.  No edema.    meds:   Benicar 20 mg 1 p.o. daily.    Stopped Lisinopril 10 due to cough.   Last eye exam:    Jan. 10, 2023 with pressure check but no dilation. Smiley Houseman.   investigations:   Ordered routine EKG. She will check with insurance about coverage of this and complete at [facility]. If too expensive, will have here in the office.    labs:   See EHR.      Mixed hyperlipidemia:  diet/exercise:   Discussed importance of both and discussed ASCVD risk score. She is not closely watching diet or exercising; room for improvement per pt.   meds:   no current meds  labs:   See EHR      GERD:  onset:   on meds since late 2019  symptoms:   02/16/23, 08/01/22:  No breakthrough symptoms. Denies abdominal pain, bloody or black looking stool. No heartburn, reflux or indigestion.  meds:   Omeprazole 20 mg 1 po  QD.  referrals:   none  investigations:   no hx of EGD        Mood swings and Anxiety:  onset:  years ago  symptoms:   02/16/23, 08/01/22  Anxiety remains stable on current meds and mood as well . No changes needed. Doing well on current meds. No SI or HI.   Meds:   Wellbutrin SR 200 mg 1 po BID.  Lexapro 20 mg QD.     Seroquel increased appetite which caused weight gain. Cymbalta was too expensive in the past. Effexor did not work and kept her awake for 32 hours. Has also tried Zoloft, Prozac, Celexa, and Buspar)  Referrals:   No current referrals.        Eczema:  onset:  off and on for several years  symptoms:   02/16/23, 08/01/22:  No concerns voiced.    Meds:   Clobetasol 0.05% cream PRN. Previously, on Triamcinolone 0.5% BID PRN. It has worked better than the Betamethasone 0.05% steroid cream.  referrals:   None currently needed                      Vitamin D and hx of Vit B12 deficiency:   meds:   Per patient on 03/02/22:  Had labs ordered by Dr. Ninfa Linden 02/08/22. Vitamin D was low. Told to get OTC Vit D 5000 units until discuss all results 03/15/22.   On 06/02/21:  Reminded her to start B12 1000 mcg daily and Vit D 2000 units daily. Only on MVI daily. Previously, OTC Vitamin D 5000 units daily.  labs:   See EHR        Tobacco Use:  02/16/23, 08/01/22:  Is smoking 1/4 ppd on average. Rarely coughs. Not yet ready to quit. Afraid of gaining weight. Declines referral. We discussed the potential risks of continued smoking. Quit years ago for almost 2 years but then started smoking again. Has over a 20 year hx of smoking.          Additional lab review:     Will consider options, including [facility] in [place].   04/21/22:  Lipid, A1C, B12  02/09/2022 ordered by rheumatologist: Hepatitis-B and C negative.  WBC 8.3, HGB 15.4, HCT 44.2, platelets 265.  Glucose 94.  BUN 6, creatinine 0.99,  GFR 72.  Sodium 139, potassium 3.8.  Calcium 9.5.  Total bilirubin 0.6, AST 19, ALT 22.  Routine urinalysis:  Negative protein, negative  blood.  Some calcium oxalate crystals noted.  Protein electrophoresis showed no M spike.  RNP antibodies elevated.  Smith antibodies less than 0.2.  Sjogren's antibodies less than 0.2.  Complement C4 normal at 16.  Free T4 1.05, TSH 2.110.  C3 complement 137.  Rheumatoid factor less than 10.0.  Vitamin-D 21.3.  Anti dsDNA antibody 1.  Lyme negative.  Anti CCP normal at 2.  Uric acid 5.2.  TPO elevated at 60, thyroglobulin antibody less than 1.0.  CK 91.  Total complement greater than 60 and normal.  CRP 12.  ANA positive with nuclear pattern 1: 640.  See scanned copy.    As of 03/02/21:  Prefers to postpone GB imaging since better. [US liver gb and duct- hida scan if needed scheduled for 02/15/21 at 7:00am was cancelled. ]        Last pap:  Plans to see GYN spring of 2025.  04/21/22; also, due for Mirena replacement April 2025 (8 year mark).   Had pap by GYN in October 2022.   12/24/19, 08/16/2018, 07/10/17        Mammogram:  Due after 04/29/23; agrees to schedule. Denies breast symptoms or concerns.   04/28/22  03/02/21:  Mammogram ordered by Dr. Clint Guy:  Negative.     Colorectal cancer screening:    Per patient on 03/02/22:  cancelled her consultation for colonoscopy. Plans to reschedule at some point.    Specialists:    Dr. Ninfa Linden due to + ANA and RNP.  Feels she likely has OA instead of RA. Ortho dx her with OA in the past. Did see Dr. Lauro Franklin in the past as well.      Immunizations:  [employer] Covid vaccine 05/28/20 at [employer]; yearly flu shots advised . She declined Pneumo 23, Tdap and HPV during well visit.         Objective:  BP 107/78   Pulse 97   Temp 36.6 C (97.8 F) (Skin)   Resp 16   Wt 108 kg (238 lb)   SpO2 95%   BMI 35.15 kg/m     General:  Cooperative.  No acute distress.  Alert and oriented x3.    Eyes:  Sclera normal.    Neck: Supple.  No cervical lymphadenopathy.  Thyroid palpable but not enlarged or tender.    Lungs:  Normal respiratory effort.  Clear to auscultation bilaterally.    Heart: Regular  rate and regular rhythm.  S1 and S2 normal.  No definite murmur.  No carotid bruits.  No abdominal bruits or pulsatile mass.  Pedal pulses palpable.  No significant swelling of ankles.    Abdomen:  Soft.  Bowel sounds present all quadrants.  Nontender.  No hepatosplenomegaly.    Musculoskeletal:  No finger joint deformities. FROM neck and bilateral upper extremities.No focal tenderness. Radial pulses 2+. Brisk capillary refill.     Skin:  Warm and dry.  No current eczema.    Neuro:  Sensation to light touch intact both upper and lower extremities.    Psych:  Speech and movement normal.  Normal affect.  Thought process and thought content normal.        Assessment/Plan:    ICD-10-CM    1. Primary hypertension  I10 EKG - Perfrmed at [facility]     olmesartan (BENICAR) 20 mg Oral Tablet  2. Screening for heart disease  Z13.6 EKG - Perfrmed at [facility]      3. Mixed hyperlipidemia  E78.2 EKG - Perfrmed at [facility]      4. Pre-diabetes  R73.03       5. Anxiety  F41.9 buPROPion (WELLBUTRIN SR) 200 mg Oral tablet sustained-release 12 hr     escitalopram oxalate (LEXAPRO) 20 mg Oral Tablet      6. Insomnia, unspecified type  G47.00 traZODone (DESYREL) 100 mg Oral Tablet      7. Tobacco use disorder  F17.200       8. Gastroesophageal reflux disease, unspecified whether esophagitis present  K21.9 omeprazole (PRILOSEC) 20 mg Oral Capsule, Delayed Release(E.C.)      9. 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 Encounter for screening for malignant neoplasm of breast, unspecified screening modality  Z12.39 MAMMO BILATERAL SCREENING          Counseling Done:  Refills as below.      Await mammogram. Monthly BSE advised and RTC for any new findings or concerns.     Left knee pain;  Reviewed ER reports and XR left knee report. MRI ordered in November to rule out internal derangement is  still pending insurance approval. From our referral, keep upcoming appointment with ortho (dr. Colette Ribas) 02/24/23.  In the meantime, routine measures/supportive care including OTC Aleve (her preference) as directed with food as well as brace.  Await ortho recommendations.    Insomnia: Chronic, stable. Continue Trazodone 100 mg po HS. Sleep hygiene discussed. Minimize caffeine.She decided against home sleep study to rule out OSA.    Hypertension: Chronic, stable. Continue Benicar 20 mg 1 po QD.  Monitor BP, log, and return to clinic if staying greater than 140/90, either parameter on average.  Bring log of readings for my review to future visits.  She understands the potential risks of uncontrolled BP.  Low-sodium diet.  Weight loss. Go to ER for any cardiovascular symptoms. Routine EKG ordered.     Mixed hyperlipidemia:  Low cholesterol diet. Exercise. Discussed ASCVD risk score of 4.3%. Since < 5, no statin at this time. Monitor labs.     Pre-diabetes:  Encouraged diabetic diet with decreased sugar, white starchy carbs. Exercise. Lose weight. Monitor.     Anxiety and mood swings: Chronic, stable. Continue Wellbutrin SR 200 mg BID. Continue Lexapro 20 mg daily.  Monitor. Declines counseling referral. Return to clinic if this persists or becomes worse. Go to ER for any suicidal or homicidal thoughts.    GERD:  Stable. Continue Omeprazole 20 mg daily. Routine recommendations.     Tobacco abuse:  Advised smoking cessation and she understands the potential risks of continued smoking. She declines referral/assistance at this time.    Eczema:  Stable. Continue PRN use of  Clobetasol 0.05% cream topically as directed. F/U if not improving or worse.     Positive ANA; arthralgia:  Continue follow up visits with Dr. Ninfa Linden . Symptomatic measures/supportive care as discussed.    Yearly well-woman/check-ups advised.      Follow up as below-6 months- but if any worsening, incomplete resolution, or new problems occur, patient is to  return sooner.      Tobacco cessation counseling performed.  She is not interested in meds  for smoking cessation and/or referral at this time. Accepts risks of continued smoking.       Orders Placed This Encounter    MAMMO BILATERAL SCREENING    EKG - Perfrmed at [facility]    traZODone (DESYREL) 100 mg Oral Tablet    buPROPion (WELLBUTRIN SR) 200 mg Oral tablet sustained-release 12 hr    escitalopram oxalate (LEXAPRO) 20 mg Oral Tablet    olmesartan (BENICAR) 20 mg Oral Tablet    omeprazole (PRILOSEC) 20 mg Oral Capsule, Delayed Release(E.C.)       Current Outpatient Medications   Medication Sig    buPROPion (WELLBUTRIN SR) 200 mg Oral tablet sustained-release 12 hr Take 1 Tablet (200 mg total) by mouth Twice daily    clobetasoL (TEMOVATE) 0.05 % Cream Apply topically Twice per day as needed    escitalopram oxalate (LEXAPRO) 20 mg Oral Tablet Take 1 tablet by mouth once daily    levonorgestrel (MIRENA) 20 mcg/24 hr intrauterine device by Intrauterine route One time    olmesartan (BENICAR) 20 mg Oral Tablet Take 1 tablet by mouth once daily    omeprazole (PRILOSEC) 20 mg Oral Capsule, Delayed Release(E.C.) Take 1 capsule by mouth once daily    traZODone (DESYREL) 100 mg Oral Tablet 1 po HS for sleep        Return in about 6 months (around 08/17/2023) for for routine follow up.    Ashby Dawes, PA-C

## 2023-03-02 IMAGING — MR MRI KNEE LT W/O CONTRAST
4 of 5 series · 26 of 40 positions shown · IV contrast (gadolinium)
Comparison: Prior x-ray dated 02/24/2023.

﻿EXAM:  60666   MRI KNEE LT W/O CONTRAST
INDICATION: History of trauma in October.  Lateral knee pain.  Diminished range of motion.
TECHNIQUE: Multiplanar, multisequential MRI of the left knee was performed without gadolinium contrast.

[Series 5: PD fat-sat · axial · left · 4.0mm · 0.53mm/px · z∈[-72,+58]mm · 8 of 30 slices shown (1 of 3)]
[im 1/30]
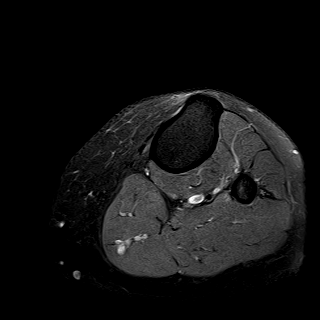
[im 5/30]
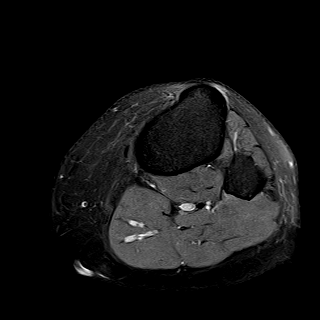
[im 9/30]
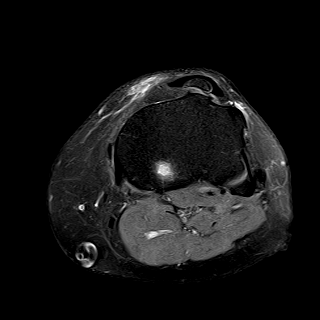
[im 13/30]
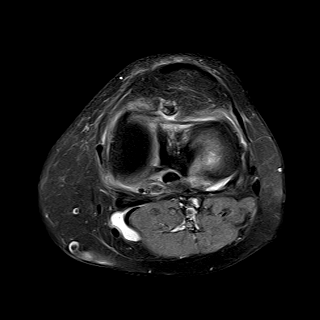
[im 17/30]
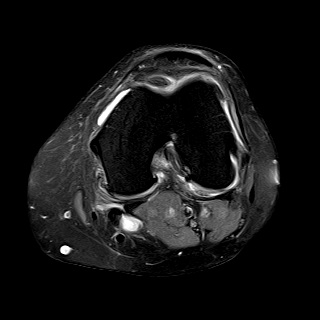
[im 21/30]
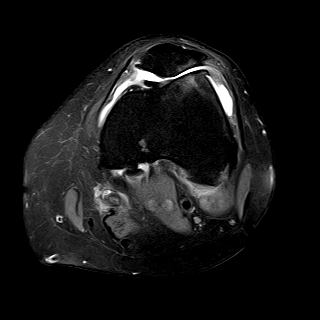
[im 25/30]
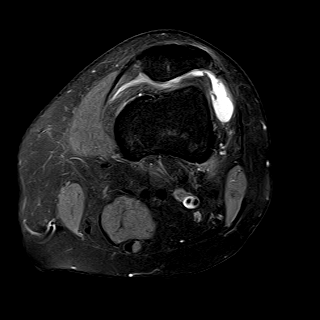
[im 30/30]
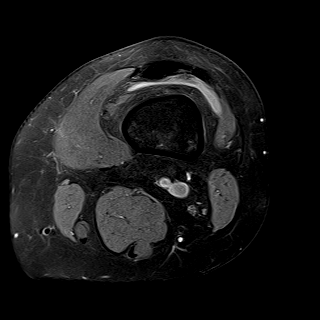

[Series 6: PD fat-sat · sagittal · left · 3.0mm · 0.29mm/px · 8 of 30 slices shown (2 of 3)]
[im 1/30]
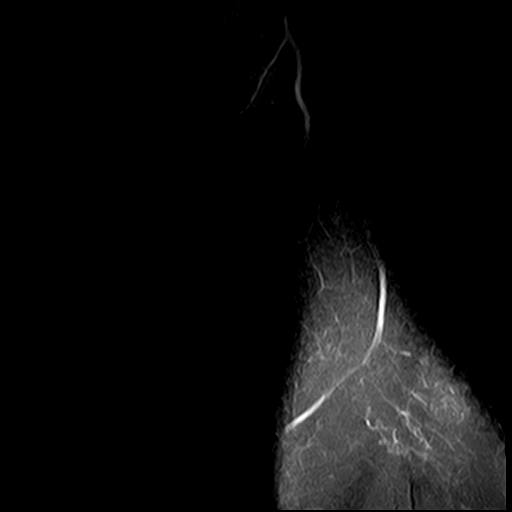
[im 5/30]
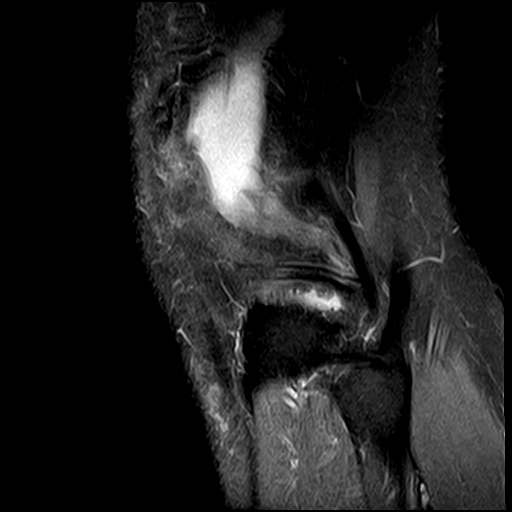
[im 9/30]
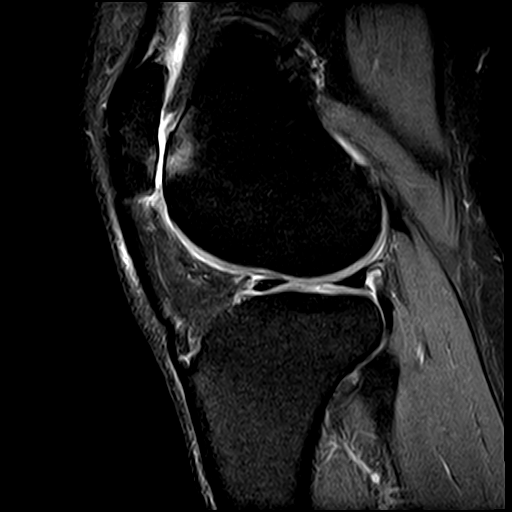
[im 13/30]
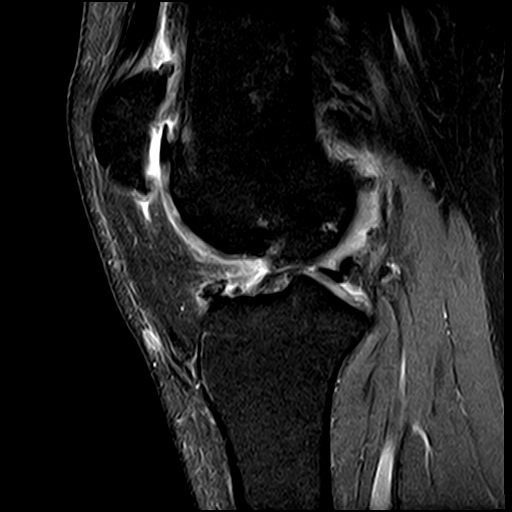
[im 17/30]
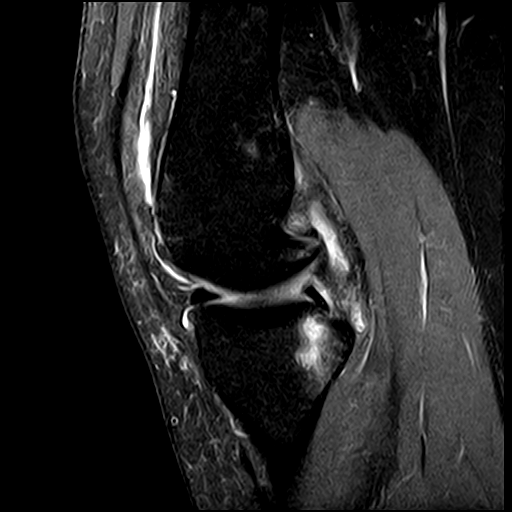
[im 21/30]
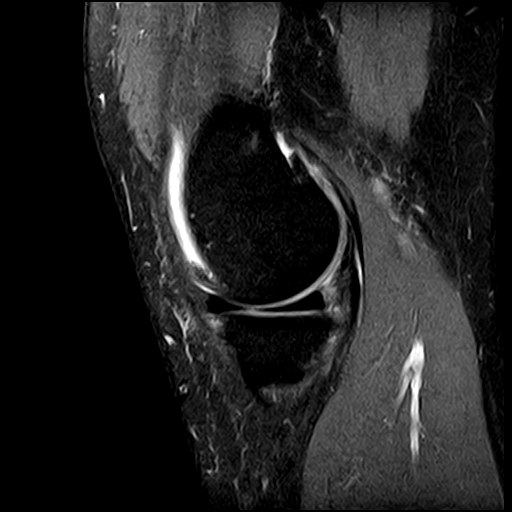
[im 25/30]
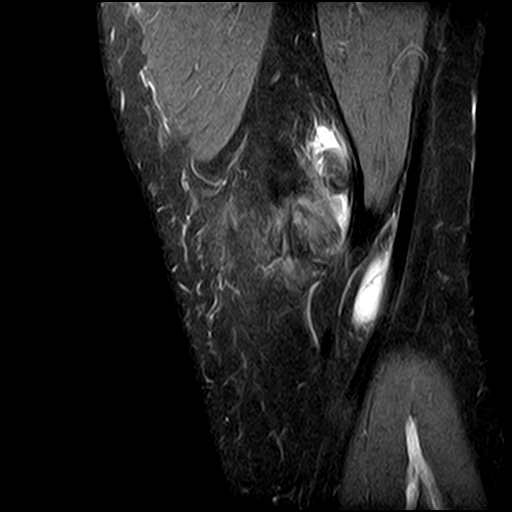
[im 30/30]
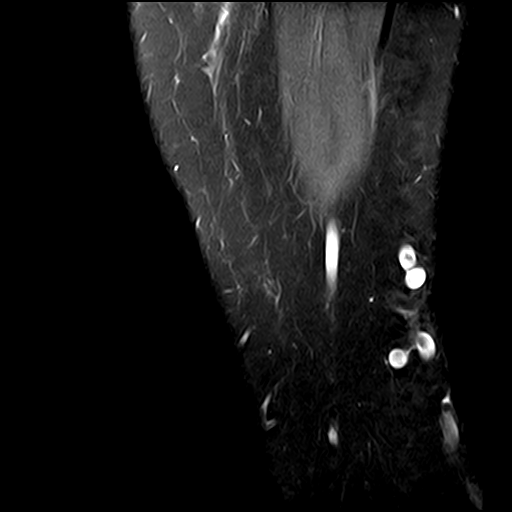

[Series 7: T1 · sagittal · left · 3.0mm · 0.29mm/px · 3 of 30 slices shown]
[im 5/30]
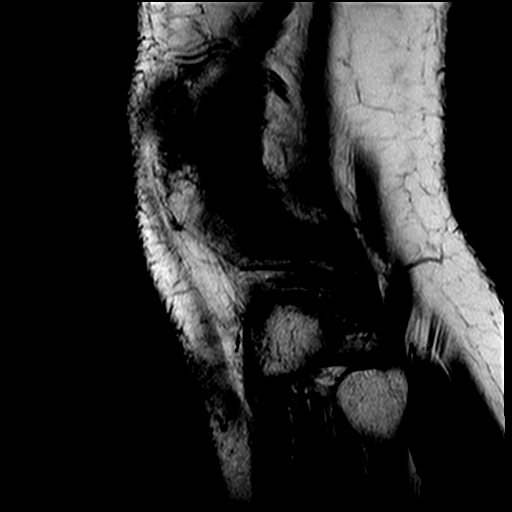
[im 17/30]
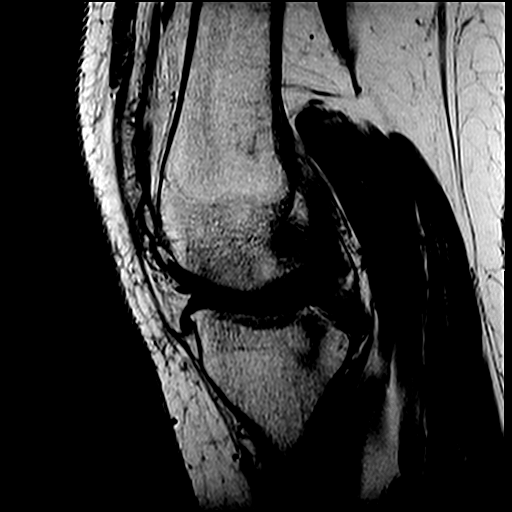
[im 25/30]
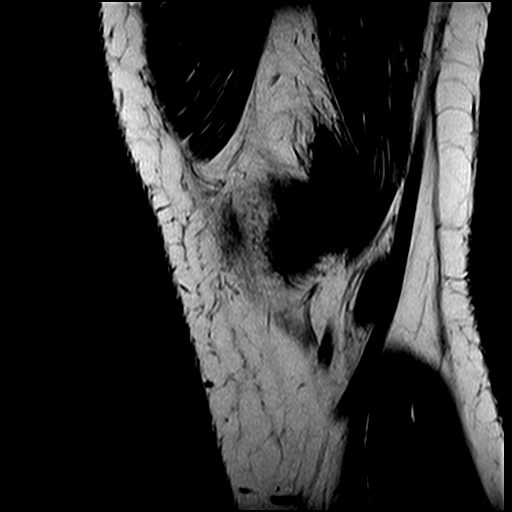

[Series 9: PD fat-sat · coronal · left · 3.5mm · 0.36mm/px · 7 of 27 slices shown (3 of 3)]
[im 1/27]
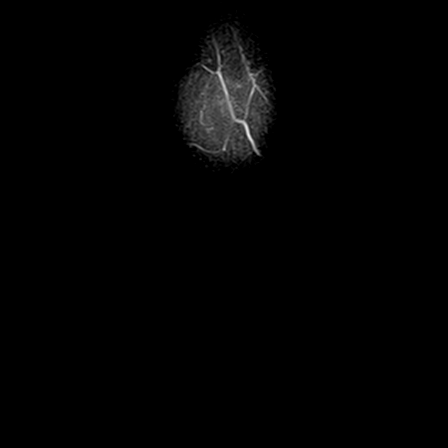
[im 4/27]
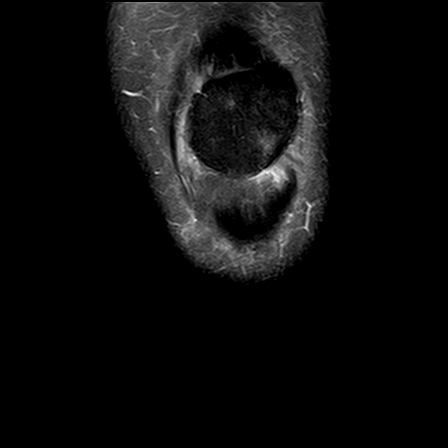
[im 8/27]
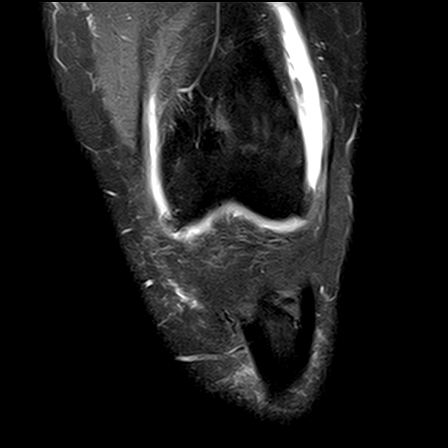
[im 12/27]
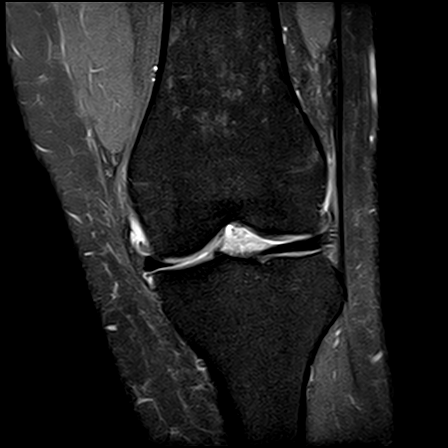
[im 15/27]
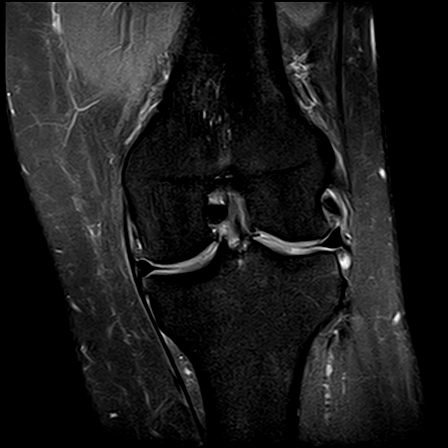
[im 19/27]
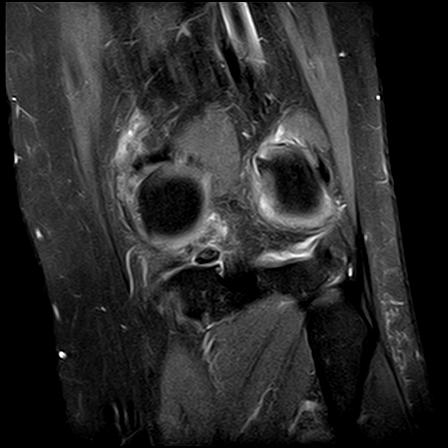
[im 23/27]
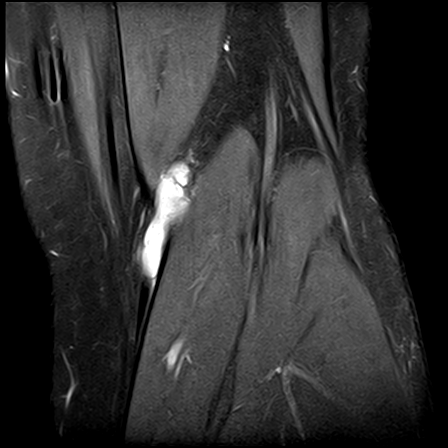

[26 of 40 positions shown; findings below may reference images not displayed]

FINDINGS: No acute bony lesions at the left knee.  The lateral meniscus is intact.  Grade 2 to grade 3 degenerative changes of lateral articular cartilage over the tibial plateau are noted.

Anterior and posterior cruciate ligaments are intact.  Ganglion cyst formation within the posterior proximal tibia is noted close to the attachment of PCL.

Medial meniscus shows no acute findings.  Medial articular cartilage shows mild grade 1 degenerative changes.

Patella alta is noted with severe, grade 4 degenerative changes of patellar articular cartilage particularly the lateral facet of the patellofemoral articulation.  Small effusion in the knee joint.  Soft tissues of the popliteal fossa show 2 cm size Baker's cyst.
IMPRESSION: 1. No acute bony lesions. Ganglion cyst of subarticular aspect of proximal tibia close to the insertion of posterior cruciate ligament.

2. Patella alta with severe grade 4 degenerative changes of patellofemoral articulation. Mild bone marrow edema on both sides.

3. Small effusion in the knee joint. Baker's cyst.

## 2023-03-27 ENCOUNTER — Telehealth (RURAL_HEALTH_CENTER): Payer: Self-pay | Admitting: Physician Assistant

## 2023-03-27 NOTE — Telephone Encounter (Signed)
Dr. Jessy Oto with [employer] got the MRI Left Knee approved faster than I did. She had the MRI done 03/02/23 at [facility]. [place]

## 2023-03-27 NOTE — Telephone Encounter (Signed)
Reviewed. Vegas Coffin, PA-C

## 2023-05-01 ENCOUNTER — Encounter (HOSPITAL_COMMUNITY): Payer: Self-pay

## 2023-05-01 ENCOUNTER — Ambulatory Visit
Admission: RE | Admit: 2023-05-01 | Discharge: 2023-05-01 | Disposition: A | Discharge status: Home / Self Care | Payer: Medicare (Managed Care) | Source: Ambulatory Visit | Attending: Physician Assistant | Admitting: Physician Assistant

## 2023-05-01 ENCOUNTER — Other Ambulatory Visit: Payer: Self-pay

## 2023-05-01 DIAGNOSIS — Z1231 Encounter for screening mammogram for malignant neoplasm of breast: Secondary | ICD-10-CM | POA: Insufficient documentation

## 2023-05-01 DIAGNOSIS — Z1239 Encounter for other screening for malignant neoplasm of breast: Secondary | ICD-10-CM

## 2023-08-18 ENCOUNTER — Ambulatory Visit (RURAL_HEALTH_CENTER): Payer: Self-pay | Admitting: Physician Assistant

## 2023-08-30 ENCOUNTER — Ambulatory Visit (RURAL_HEALTH_CENTER): Payer: Medicare (Managed Care) | Attending: Physician Assistant | Admitting: Physician Assistant

## 2023-08-30 ENCOUNTER — Other Ambulatory Visit: Payer: Self-pay

## 2023-08-30 ENCOUNTER — Encounter (RURAL_HEALTH_CENTER): Payer: Self-pay | Admitting: Physician Assistant

## 2023-08-30 VITALS — BP 99/75 | HR 106 | Temp 98.1°F | Resp 16 | Wt 252.0 lb

## 2023-08-30 DIAGNOSIS — I10 Essential (primary) hypertension: Secondary | ICD-10-CM | POA: Insufficient documentation

## 2023-08-30 DIAGNOSIS — Z79899 Other long term (current) drug therapy: Secondary | ICD-10-CM | POA: Insufficient documentation

## 2023-08-30 DIAGNOSIS — F419 Anxiety disorder, unspecified: Secondary | ICD-10-CM | POA: Insufficient documentation

## 2023-08-30 DIAGNOSIS — F17210 Nicotine dependence, cigarettes, uncomplicated: Secondary | ICD-10-CM | POA: Insufficient documentation

## 2023-08-30 DIAGNOSIS — M25562 Pain in left knee: Secondary | ICD-10-CM | POA: Insufficient documentation

## 2023-08-30 DIAGNOSIS — G4700 Insomnia, unspecified: Secondary | ICD-10-CM | POA: Insufficient documentation

## 2023-08-30 DIAGNOSIS — F17200 Nicotine dependence, unspecified, uncomplicated: Secondary | ICD-10-CM

## 2023-08-30 DIAGNOSIS — R7303 Prediabetes: Secondary | ICD-10-CM | POA: Insufficient documentation

## 2023-08-30 DIAGNOSIS — E782 Mixed hyperlipidemia: Secondary | ICD-10-CM | POA: Insufficient documentation

## 2023-08-30 DIAGNOSIS — L309 Dermatitis, unspecified: Secondary | ICD-10-CM | POA: Insufficient documentation

## 2023-08-30 DIAGNOSIS — K219 Gastro-esophageal reflux disease without esophagitis: Secondary | ICD-10-CM | POA: Insufficient documentation

## 2023-08-30 MED ORDER — BUPROPION HCL SR 200 MG TABLET,12 HR SUSTAINED-RELEASE: 200.0000 mg | ORAL_TABLET | Freq: Two times a day (BID) | ORAL | 1 refills | Status: DC

## 2023-08-30 MED ORDER — OLMESARTAN 20 MG TABLET: 20.0000 mg | ORAL_TABLET | Freq: Every day | ORAL | 1 refills | Status: DC

## 2023-08-30 MED ORDER — TRAZODONE 100 MG TABLET: ORAL_TABLET | ORAL | 1 refills | Status: DC

## 2023-08-30 MED ORDER — OMEPRAZOLE 20 MG CAPSULE,DELAYED RELEASE: 20.0000 mg | DELAYED_RELEASE_CAPSULE | Freq: Every day | ORAL | 1 refills | Status: DC

## 2023-08-30 MED ORDER — ESCITALOPRAM 20 MG TABLET: 20.0000 mg | ORAL_TABLET | Freq: Every day | ORAL | 1 refills | Status: DC
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
## 2024-01-03 ENCOUNTER — Other Ambulatory Visit (HOSPITAL_COMMUNITY): Payer: Self-pay

## 2024-01-03 ENCOUNTER — Encounter (HOSPITAL_COMMUNITY): Payer: Self-pay

## 2024-01-03 DIAGNOSIS — Z1231 Encounter for screening mammogram for malignant neoplasm of breast: Secondary | ICD-10-CM

## 2024-01-10 ENCOUNTER — Encounter (HOSPITAL_COMMUNITY): Payer: Self-pay

## 2024-01-18 ENCOUNTER — Encounter (HOSPITAL_COMMUNITY): Payer: Self-pay

## 2024-03-01 ENCOUNTER — Encounter (RURAL_HEALTH_CENTER): Payer: Self-pay | Admitting: Physician Assistant

## 2024-03-01 ENCOUNTER — Other Ambulatory Visit: Payer: Self-pay

## 2024-03-01 ENCOUNTER — Ambulatory Visit (RURAL_HEALTH_CENTER): Payer: Medicare (Managed Care) | Attending: Physician Assistant | Admitting: Physician Assistant

## 2024-03-01 VITALS — BP 109/83 | HR 90 | Temp 97.6°F | Resp 16 | Wt 254.0 lb

## 2024-03-01 DIAGNOSIS — E782 Mixed hyperlipidemia: Secondary | ICD-10-CM

## 2024-03-01 DIAGNOSIS — L309 Dermatitis, unspecified: Secondary | ICD-10-CM

## 2024-03-01 DIAGNOSIS — G4700 Insomnia, unspecified: Secondary | ICD-10-CM | POA: Insufficient documentation

## 2024-03-01 DIAGNOSIS — F17200 Nicotine dependence, unspecified, uncomplicated: Secondary | ICD-10-CM

## 2024-03-01 DIAGNOSIS — R7303 Prediabetes: Secondary | ICD-10-CM

## 2024-03-01 DIAGNOSIS — I10 Essential (primary) hypertension: Secondary | ICD-10-CM

## 2024-03-01 DIAGNOSIS — K219 Gastro-esophageal reflux disease without esophagitis: Secondary | ICD-10-CM | POA: Insufficient documentation

## 2024-03-01 DIAGNOSIS — F419 Anxiety disorder, unspecified: Secondary | ICD-10-CM | POA: Insufficient documentation

## 2024-03-01 MED ORDER — BUPROPION HCL SR 200 MG TABLET,12 HR SUSTAINED-RELEASE: 200.0000 mg | ORAL_TABLET | Freq: Two times a day (BID) | ORAL | 1 refills | Status: AC

## 2024-03-01 MED ORDER — OLMESARTAN 20 MG TABLET: 20.0000 mg | ORAL_TABLET | Freq: Every day | ORAL | 1 refills | Status: AC

## 2024-03-01 MED ORDER — ESCITALOPRAM 20 MG TABLET: 20.0000 mg | ORAL_TABLET | Freq: Every day | ORAL | 1 refills | Status: AC

## 2024-03-01 MED ORDER — TRAZODONE 100 MG TABLET: ORAL_TABLET | ORAL | 1 refills | Status: AC

## 2024-03-01 MED ORDER — OMEPRAZOLE 20 MG CAPSULE,DELAYED RELEASE: 20.0000 mg | DELAYED_RELEASE_CAPSULE | Freq: Every day | ORAL | 1 refills | Status: AC

## 2024-03-01 NOTE — Progress Notes (Signed)
 FAMILY MEDICINE, [facility]  [address] [place]  [place] [address]  Operated by [facility]     Name: Andrea Hodges MRN:  [id number]   Date: 03/01/2024 Age: 48 y.o.          Provider: Mylinda Fire, PA-C    Reason for visit: Follow Up 6 Months                   History of Present Illness  Andrea Hodges is a 48 year old female who presents for a routine follow-up visit.     Knee pain  - Intermittent achy pain in both knees without recent exacerbations  - MRI performed one year ago due to severe pain impacting ability to stand at desk  - No recent need for over-the-counter analgesics such as naproxen  - Currently no significant functional limitations    Glycemic control and polydipsia  - A1c measured at 5.7% in February 2024  - Experiences excessive thirst and dry mouth, especially in the mornings  - No recent laboratory follow-up    Hypertension  - Takes Benicar  20 mg daily  - No issues with blood pressure control  - No chest pain, shortness of breath, palpitations, dizziness, or syncope    Mood, anxiety, and sleep disturbance  - Takes Trazodone  100 mg nightly, resulting in improved sleep  - Wakes once nightly to urinate  - Takes Wellbutrin  SR 200 mg twice daily and Lexapro  20 mg once daily  - Mood and anxiety are improved despite ongoing stress    Gastroesophageal reflux disease  - Takes Omeprazole  20 mg daily  - Significant improvement in symptoms  - No need for additional medications  - No abdominal pain, melena, or hematochezia    Nicotine use  - Smokes a quarter pack of cigarettes daily    Gynecologic history  - IUD replaced in October 2025 [[facility]]  - No Pap smear performed at time of IUD replacement    Eczema  - Experiences flare-ups during winter months  - Symptoms are manageable with moisturizers and Clobetasol  as needed    Additional lab review:     Will consider options, including Q labs or [facility] or [facility] in [place].   03/02/23:   MRI left knee  01/10/23:  XR left knee  Decided not to complete home sleep study to rule out OSA as of 08/01/22.   04/21/22:  Lipid, A1C, B12  02/09/2022 ordered by rheumatologist: Hepatitis-B and C negative.  WBC 8.3, HGB 15.4, HCT 44.2, platelets 265.  Glucose 94.  BUN 6, creatinine 0.99, GFR 72.  Sodium 139, potassium 3.8.  Calcium 9.5.  Total bilirubin 0.6, AST 19, ALT 22.  Routine urinalysis:  Negative protein, negative blood.  Some calcium oxalate crystals noted.  Protein electrophoresis showed no M spike.  RNP antibodies elevated.  Smith antibodies less than 0.2.  Sjogren's antibodies less than 0.2.  Complement C4 normal at 16.  Free T4 1.05, TSH 2.110.  C3 complement 137.  Rheumatoid factor less than 10.0.  Vitamin-D 21.3.  Anti dsDNA antibody 1.  Lyme negative.  Anti CCP normal at 2.  Uric acid 5.2.  TPO elevated at 60, thyroglobulin antibody less than 1.0.  CK 91.  Total complement greater than 60 and normal.  CRP 12.  ANA positive with nuclear pattern 1: 640.  See scanned copy.    As of 03/02/21:  Prefers to postpone GB imaging since  better. [US  liver gb and duct- hida scan if needed scheduled for 02/15/21 at 7:00am was cancelled. ]    Cardiovascular investigations:    Ordered routine EKG 02/16/23. She will check with insurance about coverage of this and complete at [facility]. If too expensive, will have here in the office.       Last pap:   IUD replacement (no pap) per patient October 2025 at [facility] in [place]  04/21/22; also, due for Mirena replacement April 2025 (8 year mark).   Had pap by GYN in October 2022.   12/24/19, 08/16/2018, 07/10/17     Mammogram:  Declines scheduling this as of 03/01/24.  05/01/23   04/28/22  03/02/21:  Mammogram ordered by Dr. Donal:  Negative.      Colorectal cancer screening:    Per patient on 03/02/22:  cancelled her consultation for colonoscopy. Plans to reschedule at some point.     Specialists:    Dr. Dominique due to + ANA and RNP.  Feels she likely has OA instead of RA. Ortho dx  her with OA in the past. Did see Dr. Saikali in the past as well.  Optometrist:  Jan. 10, 2023 with pressure check but no dilation. Morna Dose.   Previously, [facility]       Immunizations:  J & J Covid vaccine 05/28/20 at [employer] [facility]; yearly flu shots advised . She declined Pneumo 23, Tdap and HPV during well visit.       Historical Data    Past Medical History:  Past Medical History:   Diagnosis Date    Anxiety     B12 deficiency     Eczema     Esophageal reflux     Hypertension     Hypokalemia     Mood swings     Osteoarthritis     Other acne     Positive ANA (antinuclear antibody)     Tobacco use disorder     Vitamin D deficiency          Allergies:  Allergies[1]  Medications:  Current Outpatient Medications   Medication Sig    buPROPion  (WELLBUTRIN  SR) 200 mg Oral tablet sustained-release 12 hr Take 1 Tablet (200 mg total) by mouth Twice daily    clobetasoL  (TEMOVATE ) 0.05 % Cream Apply topically Twice per day as needed    escitalopram  oxalate (LEXAPRO ) 20 mg Oral Tablet Take 1 Tablet (20 mg total) by mouth Daily    levonorgestrel (MIRENA) 20 mcg/24 hr intrauterine device by Intrauterine route One time    olmesartan  (BENICAR ) 20 mg Oral Tablet Take 1 Tablet (20 mg total) by mouth Daily    omeprazole  (PRILOSEC) 20 mg Oral Capsule, Delayed Release(E.C.) Take 1 Capsule (20 mg total) by mouth Daily    traZODone  (DESYREL ) 100 mg Oral Tablet 1 po HS for sleep     Social History:  Social History     Socioeconomic History    Marital status: Married   Tobacco Use    Smoking status: Every Day     Current packs/day: 0.25     Average packs/day: 0.3 packs/day for 23.0 years (5.7 ttl pk-yrs)     Types: Cigarettes     Start date: 2003     Passive exposure: Current    Smokeless tobacco: Never   Vaping Use    Vaping status: Never Used   Substance and Sexual Activity    Alcohol use: Yes     Comment: holidays/special occasions only    Drug  use: Never           Physical Exam:  Vital Signs:  Vitals:    03/01/24 1406   BP:  109/83   Pulse: 90   Resp: 16   Temp: 36.4 C (97.6 F)   TempSrc: Skin   SpO2: 94%   Weight: 115 kg (254 lb)       Physical Exam  VITALS: BP- 109/83. Vitals reviewed.   CONSTITUTIONAL: Patient is not in acute distress.  EYES: No scleral icterus. Extraocular movements intact.  NECK: No carotid bruit. Thyroid palpable but not enlarged or tender.  LYMPHADENOPATHY: No cervical adenopathy.  CARDIOVASCULAR: Normal rate and regular rhythm. Normal pulses. Normal heart sounds. No abdominal bruits or pulsatile mass.  PULMONARY: Pulmonary effort is normal. Breath sounds are normal. Lungs clear to auscultation bilaterally.  ABDOMINAL: Bowel sounds are normal. Abdomen is soft. There is no abdominal tenderness. There is no guarding.  MUSCULOSKELETAL: Neck supple. No edema in right lower leg. No edema in left lower leg.  SKIN: Skin is warm and dry.  NEUROLOGICAL: No focal deficit present. She is alert and oriented to person, place, and time. No sensory deficit. No weakness.  PSYCHIATRIC: Mood normal. Behavior normal. Thought content normal.     Results  Labs  HbA1c (04/2022): 5.7  Total Cholesterol (04/2022): 248       Previously completed labs, imaging studies and medical records were reviewed as part of this office visit.     Assessment/Plan:    ICD-10-CM    1. Primary hypertension  I10 olmesartan  (BENICAR ) 20 mg Oral Tablet      2. Anxiety  F41.9 buPROPion  (WELLBUTRIN  SR) 200 mg Oral tablet sustained-release 12 hr     escitalopram  oxalate (LEXAPRO ) 20 mg Oral Tablet      3. Insomnia, unspecified type  G47.00 traZODone  (DESYREL ) 100 mg Oral Tablet      4. Gastroesophageal reflux disease, unspecified whether esophagitis present  K21.9 omeprazole  (PRILOSEC) 20 mg Oral Capsule, Delayed Release(E.C.)      5. Pre-diabetes  R73.03       6. Mixed hyperlipidemia  E78.2       7. Eczema, unspecified type  L30.9       8. Tobacco use disorder  F17.200           Assessment & Plan  Primary hypertension  Blood pressure is well-controlled with  current medication regimen. No recent symptoms of chest pain, shortness of breath, palpitations, dizziness, or syncope.  - Continue Olmesartan  (Benicar ) 20 mg oral QD    Anxiety disorder  Anxiety is well-managed with current medication regimen. Stressors include financial pressures and family responsibilities.  - Continue Bupropion  (Wellbutrin  SR) 200 mg oral BID  - Continue Escitalopram  oxalate (Lexapro ) 20 mg oral QD    Insomnia  Managed with Trazodone . Sleep duration is approximately four hours per night, with nocturia interrupting sleep. Improvement noted compared to previous state.  - Continue Trazodone  (Desyrel ) 100 mg oral HS    Gastroesophageal reflux disease  GERD symptoms are well-controlled with Omeprazole . No need for additional antacids or H2 blockers.  - Continue Omeprazole  (Prilosec) 20 mg oral QD    Pre-diabetes  A1c was 5.7% in February 2024, indicating pre-diabetes. Reports increased thirst and dry mouth, particularly in the mornings. No recent blood work to assess current status.  - Encouraged completion of blood work to assess current A1c and glucose levels  - Follow diabetic dietary guidelines    Mixed hyperlipidemia  Previous cholesterol level was  248 mg/dL in February 7975. No recent lipid panel to assess current status.  - Encouraged completion of blood work to assess current lipid levels  - Follow a heart healthy, low fat, low cholesterol diet    Eczema  Well-managed with regular moisturizing and use of Clobetasol  as needed. Symptoms are more pronounced in winter.  - Continue Clobetasol  (Temovate ) 0.05% topical BID PRN  - Continue regular moisturizing with Eucerin lotion    Tobacco use disorder  Continues to smoke approximately a quarter pack of cigarettes per day. Expresses occasional desire to quit but has not made significant attempts.  - Encouraged smoking cessation efforts  - Accepts risks of continued smoking.     General health maintenance  Discussed the importance of regular  screenings and lifestyle modifications. Mammogram completed in February 2025. Colonoscopy screening deferred. No recent Pap smear during IUD replacement.  - Encouraged healthy lifestyle choices, including diet and exercise  - Discussed the importance of regular screenings and follow-up appointments  - Declines scheduling mammogram at this time.     Return in about 6 months (around 08/30/2024) for for routine follow up.        Orders Placed This Encounter    buPROPion  (WELLBUTRIN  SR) 200 mg Oral tablet sustained-release 12 hr    escitalopram  oxalate (LEXAPRO ) 20 mg Oral Tablet    olmesartan  (BENICAR ) 20 mg Oral Tablet    omeprazole  (PRILOSEC) 20 mg Oral Capsule, Delayed Release(E.C.)    traZODone  (DESYREL ) 100 mg Oral Tablet     There are no discontinued medications.      Depression screening is negative. PHQ 2 Total: 0     You can see your note(s) in [facility]. It is common for you to encounter certain medical terminology which may be unfamiliar to you. You might see results before your provider does so please give at least 2 business days for review. Please have this understanding, that NOT all abnormal results are significant. Our office will contact you for any urgent or emergent action if necessary. If you have any questions or concerns, feel free to send a MyChart message or call the office. Please call with any new or concerning symptoms.     Return in about 6 months (around 08/30/2024) for for routine follow up.    Mylinda Fire, PA-C     This note was created with assistance from [employer] via capture of conversational audio. Consent was obtained from the patient and all parties present prior to recording.           [1]   Allergies  Allergen Reactions    Doxycycline Rash    Sulfa (Sulfonamides) Rash    Sulfamethoxazole Rash    Trimethoprim     Ibuprofen Swelling     Lips swell

## 2024-09-03 ENCOUNTER — Ambulatory Visit (RURAL_HEALTH_CENTER): Payer: Self-pay | Admitting: Physician Assistant
